# Patient Record
Sex: Female | Born: 1950 | Race: White | Hispanic: No | Smoking: Never smoker
Health system: Southern US, Community
[De-identification: ages and names within clinical notes are randomized; demographics above are authoritative.]

## PROBLEM LIST (undated history)

## (undated) DIAGNOSIS — F319 Bipolar disorder, unspecified: Secondary | ICD-10-CM

## (undated) DIAGNOSIS — R5383 Other fatigue: Secondary | ICD-10-CM

## (undated) DIAGNOSIS — M2550 Pain in unspecified joint: Secondary | ICD-10-CM

## (undated) DIAGNOSIS — E785 Hyperlipidemia, unspecified: Secondary | ICD-10-CM

## (undated) DIAGNOSIS — I10 Essential (primary) hypertension: Secondary | ICD-10-CM

## (undated) DIAGNOSIS — R002 Palpitations: Secondary | ICD-10-CM

## (undated) HISTORY — DX: Bipolar disorder, unspecified: F31.9

## (undated) HISTORY — DX: Essential (primary) hypertension: I10

## (undated) HISTORY — DX: Other fatigue: R53.83

## (undated) HISTORY — DX: Palpitations: R00.2

## (undated) HISTORY — DX: Hyperlipidemia, unspecified: E78.5

## (undated) HISTORY — DX: Pain in unspecified joint: M25.50

---

## 1978-12-28 HISTORY — PX: TUBAL LIGATION: SHX77

## 1979-12-29 HISTORY — PX: PARTIAL HYSTERECTOMY: SHX80

## 1993-12-28 HISTORY — PX: CHOLECYSTECTOMY: SHX55

## 1998-09-19 ENCOUNTER — Encounter: Admission: RE | Admit: 1998-09-19 | Discharge: 1998-09-19 | Payer: Self-pay | Admitting: *Deleted

## 2003-03-03 ENCOUNTER — Inpatient Hospital Stay (HOSPITAL_COMMUNITY): Admission: EM | Admit: 2003-03-03 | Discharge: 2003-03-05 | Payer: Self-pay | Admitting: Psychiatry

## 2003-03-28 ENCOUNTER — Encounter: Admission: RE | Admit: 2003-03-28 | Discharge: 2003-03-28 | Payer: Self-pay | Admitting: Psychiatry

## 2003-04-25 ENCOUNTER — Encounter: Admission: RE | Admit: 2003-04-25 | Discharge: 2003-04-25 | Payer: Self-pay | Admitting: *Deleted

## 2003-07-13 ENCOUNTER — Encounter: Admission: RE | Admit: 2003-07-13 | Discharge: 2003-07-13 | Payer: Self-pay | Admitting: Psychiatry

## 2003-07-24 ENCOUNTER — Other Ambulatory Visit (HOSPITAL_COMMUNITY): Admission: RE | Admit: 2003-07-24 | Discharge: 2003-07-25 | Payer: Self-pay | Admitting: Psychiatry

## 2004-04-03 ENCOUNTER — Encounter: Admission: RE | Admit: 2004-04-03 | Discharge: 2004-04-03 | Payer: Self-pay | Admitting: Psychiatry

## 2004-10-07 ENCOUNTER — Ambulatory Visit (HOSPITAL_COMMUNITY): Payer: Self-pay | Admitting: Psychiatry

## 2005-01-07 ENCOUNTER — Ambulatory Visit (HOSPITAL_COMMUNITY): Payer: Self-pay | Admitting: Psychiatry

## 2005-04-06 ENCOUNTER — Ambulatory Visit (HOSPITAL_COMMUNITY): Payer: Self-pay | Admitting: Psychiatry

## 2007-10-14 ENCOUNTER — Ambulatory Visit: Payer: Self-pay | Admitting: Family Medicine

## 2007-10-14 DIAGNOSIS — R002 Palpitations: Secondary | ICD-10-CM | POA: Insufficient documentation

## 2007-11-08 ENCOUNTER — Encounter: Admission: RE | Admit: 2007-11-08 | Discharge: 2007-11-08 | Payer: Self-pay | Admitting: Family Medicine

## 2007-11-08 DIAGNOSIS — F3289 Other specified depressive episodes: Secondary | ICD-10-CM | POA: Insufficient documentation

## 2007-11-08 DIAGNOSIS — F329 Major depressive disorder, single episode, unspecified: Secondary | ICD-10-CM | POA: Insufficient documentation

## 2007-11-22 ENCOUNTER — Encounter: Payer: Self-pay | Admitting: Family Medicine

## 2008-04-19 ENCOUNTER — Encounter: Admission: RE | Admit: 2008-04-19 | Discharge: 2008-04-19 | Payer: Self-pay | Admitting: Family Medicine

## 2009-10-02 ENCOUNTER — Encounter: Admission: RE | Admit: 2009-10-02 | Discharge: 2009-10-02 | Payer: Self-pay | Admitting: Sports Medicine

## 2009-12-28 HISTORY — PX: OTHER SURGICAL HISTORY: SHX169

## 2010-03-26 ENCOUNTER — Ambulatory Visit (HOSPITAL_COMMUNITY): Payer: Self-pay | Admitting: Psychiatry

## 2010-04-29 ENCOUNTER — Ambulatory Visit (HOSPITAL_COMMUNITY): Payer: Self-pay | Admitting: Licensed Clinical Social Worker

## 2010-05-05 ENCOUNTER — Ambulatory Visit (HOSPITAL_COMMUNITY): Payer: Self-pay | Admitting: Psychiatry

## 2010-05-13 ENCOUNTER — Ambulatory Visit: Payer: Self-pay | Admitting: Family

## 2010-05-13 DIAGNOSIS — M2550 Pain in unspecified joint: Secondary | ICD-10-CM | POA: Insufficient documentation

## 2010-05-13 DIAGNOSIS — R5383 Other fatigue: Secondary | ICD-10-CM

## 2010-05-13 DIAGNOSIS — R5381 Other malaise: Secondary | ICD-10-CM | POA: Insufficient documentation

## 2010-05-13 LAB — CONVERTED CEMR LAB
AST: 21 units/L (ref 0–37)
Alkaline Phosphatase: 61 units/L (ref 39–117)
Basophils Absolute: 0 10*3/uL (ref 0.0–0.1)
Basophils Relative: 0 % (ref 0–1)
Free T4: 0.98 ng/dL (ref 0.80–1.80)
Glucose, Bld: 79 mg/dL (ref 70–99)
MCHC: 32.1 g/dL (ref 30.0–36.0)
Neutro Abs: 3.3 10*3/uL (ref 1.7–7.7)
Neutrophils Relative %: 57 % (ref 43–77)
Platelets: 185 10*3/uL (ref 150–400)
RDW: 13.1 % (ref 11.5–15.5)
Sodium: 143 meq/L (ref 135–145)
T3, Free: 2.6 pg/mL (ref 2.3–4.2)
Total Bilirubin: 0.5 mg/dL (ref 0.3–1.2)
Total Protein: 6.7 g/dL (ref 6.0–8.3)

## 2010-05-14 ENCOUNTER — Telehealth: Payer: Self-pay | Admitting: Family

## 2010-05-14 ENCOUNTER — Ambulatory Visit (HOSPITAL_COMMUNITY): Payer: Self-pay | Admitting: Licensed Clinical Social Worker

## 2010-06-02 ENCOUNTER — Ambulatory Visit (HOSPITAL_COMMUNITY): Payer: Self-pay | Admitting: Licensed Clinical Social Worker

## 2010-06-03 ENCOUNTER — Encounter: Payer: Self-pay | Admitting: Family Medicine

## 2010-06-03 ENCOUNTER — Ambulatory Visit: Payer: Self-pay | Admitting: Family

## 2010-06-04 ENCOUNTER — Telehealth: Payer: Self-pay | Admitting: Family

## 2010-06-04 LAB — CONVERTED CEMR LAB: Vitamin B-12: 450 pg/mL (ref 211–911)

## 2010-06-10 ENCOUNTER — Telehealth: Payer: Self-pay | Admitting: Family

## 2010-06-24 ENCOUNTER — Ambulatory Visit (HOSPITAL_COMMUNITY): Payer: Self-pay | Admitting: Psychiatry

## 2010-07-01 ENCOUNTER — Ambulatory Visit (HOSPITAL_COMMUNITY): Payer: Self-pay | Admitting: Licensed Clinical Social Worker

## 2010-07-31 ENCOUNTER — Ambulatory Visit: Payer: Self-pay | Admitting: Family Medicine

## 2010-08-04 ENCOUNTER — Encounter: Payer: Self-pay | Admitting: Family Medicine

## 2010-08-05 LAB — CONVERTED CEMR LAB
LDL Cholesterol: 76 mg/dL (ref 0–99)
Triglycerides: 115 mg/dL (ref <150)

## 2010-08-25 ENCOUNTER — Ambulatory Visit: Payer: Self-pay | Admitting: Family Medicine

## 2010-08-25 DIAGNOSIS — M25569 Pain in unspecified knee: Secondary | ICD-10-CM | POA: Insufficient documentation

## 2010-08-26 ENCOUNTER — Encounter (INDEPENDENT_AMBULATORY_CARE_PROVIDER_SITE_OTHER): Payer: Self-pay | Admitting: *Deleted

## 2010-09-17 ENCOUNTER — Ambulatory Visit: Payer: Self-pay | Admitting: Family Medicine

## 2010-09-30 ENCOUNTER — Encounter: Admission: RE | Admit: 2010-09-30 | Discharge: 2010-09-30 | Payer: Self-pay | Admitting: Family Medicine

## 2010-10-02 LAB — HM MAMMOGRAPHY: HM Mammogram: NORMAL

## 2010-10-09 ENCOUNTER — Ambulatory Visit: Payer: Self-pay | Admitting: Family Medicine

## 2010-12-17 ENCOUNTER — Telehealth: Payer: Self-pay | Admitting: Family Medicine

## 2011-01-27 NOTE — Assessment & Plan Note (Signed)
Summary: CPE   Vital Signs:  Patient profile:   60 year old female Height:      64.5 inches Weight:      232 pounds Pulse rate:   64 / minute BP sitting:   146 / 86  (left arm) Cuff size:   regular  Vitals Entered By: Avon Gully CMA, Duncan Dull) (July 31, 2010 3:57 PM) CC: CPE   Primary Care Provider:  Linford Arnold, C  CC:  CPE.  History of Present Illness: No hx of prior BP.  Having a hard time gettingher weight down.  Has been try to work on it. No specific complaints.     Current Medications (verified): 1)  Xanax 0.5 Mg Tabs (Alprazolam) .... Take 1 Tab By Mouth At Bedtime 2)  Lisinopril 10 Mg Tabs (Lisinopril) .... Take 1 Tablet By Mouth Every Morning 3)  Simvastatin 20 Mg Tabs (Simvastatin) .... Take 1 Tab By Mouth At Bedtime 4)  Vitamin D 1000 Unit Tabs (Cholecalciferol) .... One Tablet By Mouth Daily 5)  Zostavax [address]109 Unt/0.52ml Solr (Zoster Vaccine Live) .... Bring To Office For Administration  Allergies (verified): 1)  ! Asa 2)  ! Codeine 3)  ! * Fish  Comments:  Nurse/Medical Assistant: The patient's medications and allergies were reviewed with the patient and were updated in the Medication and Allergy Lists. Avon Gully CMA, Duncan Dull) (July 31, 2010 3:58 PM)  Past History:  Past Medical History: Last updated: 06/23/2010 PALPITATIONS ARTHRALGIA  FATIGUE BIPOLAR AFFECTIVE DISORDER   Past Surgical History: Last updated: 10/14/2007 Tubal ligation 1980 Partial hysterectomy 1981 Cholecystectomy 1995  Family History: Last updated: 10/14/2007 GM with [place] GM with MI GF with suicide Mother, Uncle, and GF with palpitations.   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  HS diploma with 2 adult children.  Never Smoked Alcohol use-no Drug use-no Regular exercise-no  Review of Systems  The patient denies anorexia, fever, weight loss, weight gain, vision loss, decreased hearing, hoarseness, chest pain,  syncope, dyspnea on exertion, peripheral edema, prolonged cough, headaches, hemoptysis, abdominal pain, melena, hematochezia, severe indigestion/heartburn, hematuria, incontinence, genital sores, muscle weakness, suspicious skin lesions, transient blindness, difficulty walking, depression, unusual weight change, abnormal bleeding, enlarged lymph nodes, breast masses, and testicular masses.    Physical Exam  General:  Well-developed,well-nourished,in no acute distress; alert,appropriate and cooperative throughout examination. Overweight.  Head:  Normocephalic and atraumatic without obvious abnormalities. No apparent alopecia or balding. Eyes:  No corneal or conjunctival inflammation noted. EOMI. Perrla.  Ears:  External ear exam shows no significant lesions or deformities.  Otoscopic examination reveals clear canals, tympanic membranes are intact bilaterally without bulging, retraction, inflammation or discharge. Hearing is grossly normal bilaterally. Nose:  External nasal examination shows no deformity or inflammation.  Mouth:  Oral mucosa and oropharynx without lesions or exudates.  Teeth in good repair. Neck:  No deformities, masses, or tenderness noted. Chest Wall:  No deformities, masses, or tenderness noted. Breasts:  No mass, nodules, thickening, tenderness, bulging, retraction, inflamation, nipple discharge or skin changes noted.   Lungs:  Normal respiratory effort, chest expands symmetrically. Lungs are clear to auscultation, no crackles or wheezes. Heart:  Normal rate and regular rhythm. S1 and S2 normal without gallop, murmur, click, rub or other extra sounds. Abdomen:  Bowel sounds positive,abdomen soft and non-tender without masses, organomegaly or hernias noted. Msk:  No deformity or scoliosis noted of thoracic or lumbar spine.   Pulses:  R and L carotid,radial,dorsalis pedis and  posterior tibial pulses are full and equal bilaterally Extremities:  No clubbing, cyanosis, edema, or  deformity noted with normal full range of motion of all joints.   Neurologic:  No cranial nerve deficits noted. Station and gait are normal. DTRs are symmetrical throughout. Sensory, motor and coordinative functions appear intact. Skin:  no rashes.   Cervical Nodes:  No lymphadenopathy noted Axillary Nodes:  No palpable lymphadenopathy Psych:  Cognition and judgment appear intact. Alert and cooperative with normal attention span and concentration. No apparent delusions, illusions, hallucinations   Impression & Recommendations:  Problem # 1:  HEALTH MAINTENANCE EXAM (ICD-V70.0) Exam is normal today. Due for lipids. Had CMP in June that was norma Encouraged weight loss and exercise Given infor for mammogram.. Given stool cards since declined colonoscopy.  Tdap given today.  Orders: T-Lipid Profile ([id number])  Complete Medication List: 1)  Xanax 0.5 Mg Tabs (Alprazolam) .... Take 1 tab by mouth at bedtime 2)  Lisinopril 10 Mg Tabs (Lisinopril) .... Take 1 tablet by mouth every morning 3)  Simvastatin 20 Mg Tabs (Simvastatin) .... Take 1 tab by mouth at bedtime 4)  Vitamin D 1000 Unit Tabs (Cholecalciferol) .... One tablet by mouth daily 5)  Zostavax 75643 Unt/0.31ml Solr (Zoster vaccine live) .... Bring to office for administration  Other Orders: Tdap => 19yrs IM ([id number]) Admin 1st Vaccine (88416)  Patient Instructions: 1)  Given stool cards.  2)  Call [phone] to schedule your mammogram.  3)  We will call you with your cholesterol results.  4)  It is important that you exercise regularly at least 20 minutes 5 times a week. If you develop chest pain, have severe difficulty breathing, or feel very tired , stop exercising immediately and seek medical attention. 5)  Take calcium +Vitamin D daily.  Flex Sig Next Due:  Not Indicated Colonoscopy Next Due:  10 yr PAP Next Due:  Not Indicated    Immunization History:  Zostavax History:    Zostavax:  zostavax  (05/28/2010)    Immunization History:  Zostavax History:    Zostavax:  zostavax (05/28/2010)  Immunizations Administered:  Tetanus Vaccine:    Vaccine Type: Tdap    Site: right deltoid    Mfr: GlaxoSmithKline    Dose: 0.5 ml    Route: IM    Given by: Sue Lush McCrimmon CMA, (AAMA)    Exp. Date: 06/26/2012    Lot #: [id number]    VIS given: 11/15/07 version given July 31, 2010.
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
## 2011-01-27 NOTE — Assessment & Plan Note (Signed)
Summary: feels tired, HR gets fast sometimes- jr   Vital Signs:  Patient profile:   60 year old female Height:      64.5 inches Weight:      233 pounds BMI:     39.52 Temp:     98.2 degrees F oral Pulse rate:   71 / minute Pulse rhythm:   regular Resp:     16 per minute BP sitting:   124 / 72  (right arm) Cuff size:   large  Vitals Entered By: Mervin Kung CMA (May 13, 2010 4:31 PM) CC: room 4  Headache daily, body aches and muscle weakness, fatigue and lack of focus since coming off of depression meds. Is Patient Diabetic? No   Primary Care Provider:  Linford Arnold, C  CC:  room 4  Headache daily, body aches and muscle weakness, and fatigue and lack of focus since coming off of depression meds..  History of Present Illness: Shelby Moore is a 60 year old female with hx of depression who presents today with c/o extreme fatigue.  Notes occasional palpitations, + HA, +muscle and joint aching. +Weakness.  Denies fever.  Was on Lamictal, and Wellbutrin which she has weaned off of under the supervision of her psychiatrist.  Her last dose of Wellbutrin was greater than 1 month ago.  She has been off of the Lamictal even longer.  Reports that her depression and mood is well controlled presently off of meds.    Allergies: 1)  ! Asa 2)  ! Codeine  Physical Exam  General:  Well-developed,well-nourished,in no acute distress; alert,appropriate and cooperative throughout examination Head:  Normocephalic and atraumatic without obvious abnormalities. No apparent alopecia or balding. Neck:  No deformities, masses, or tenderness noted. Lungs:  Normal respiratory effort, chest expands symmetrically. Lungs are clear to auscultation, no crackles or wheezes. Heart:  Normal rate and regular rhythm. S1 and S2 normal without gallop, murmur, click, rub or other extra sounds. Abdomen:  Bowel sounds positive,abdomen soft and non-tender without masses, organomegaly or hernias noted.   Impression &  Recommendations:  Problem # 1:  FATIGUE (ICD-780.79) Assessment New EKG notes nonspecific T wave abnormality, sinus rythm 68.  Pt with intermittent palpitations.  Review of pt's record notes that patient was evaluated by Dr Linford Arnold back in 2008 for palpitations and was noted to have an abnormal EKG at that time.  It was recommended that she have a stress test, however she did not complete this.    TFT's WNL,  RA neg, CBC, CMET WNL.  ANA pending.  It is possible that patient's symptoms are related to Wellbutrin withdrawal, however she has been off of this drug for greater than 1 month.  Given recurrent nature of palpitations/abnormal EKG and normal TFT's will plan referral to cardiology for further evaluation +/- stress testing if indicated.  Will plan follow up in 2 weeks.    Orders: T-Comprehensive Metabolic Panel [id number]) T-CBC w/Diff ([id number])  Problem # 2:  PALPITATIONS (ICD-785.1) Assessment: Deteriorated Cardiology referral see above discussion. Orders: T-Comprehensive Metabolic Panel [id number]) T-CBC w/Diff [id number] [phone]) T-TSH [id number]) T-T3, Free [id number]) T-T4, Free [phone]) Cardiology Referral (Cardiology)  Complete Medication List: 1)  Xanax 0.5 Mg Tabs (Alprazolam) .... Take 1 tab by mouth at bedtime 2)  Lisinopril 10 Mg Tabs (Lisinopril) .... Take 1 tablet by mouth every morning 3)  Simvastatin 20 Mg Tabs (Simvastatin) .... Take 1 tab by mouth at bedtime  Other Orders: T-ANA [id number]) T-RA (Rheumatoid Factor) [phone])  Patient Instructions: 1)  Please complete your lab work. 2)  Follow up in 2 weeks  Current Allergies (reviewed today): ! ASA ! CODEINE

## 2011-01-27 NOTE — Letter (Signed)
Summary: Out of Work  [facility]  [address]   [place], [place] [address]   Phone: [phone]  Fax: [phone]    August 26, 2010   Employee:  Flynn A Rosevear    To Whom It May Concern:   For Medical reasons, please excuse the above named employee from work for the following dates:  Start:   08/25/10  End:   08/26/10 (pt is to return to work on this date)  If you need additional information, please feel free to contact our office.         Sincerely,    Nani Gasser, MD

## 2011-01-27 NOTE — Letter (Signed)
Summary: Anxiety Questionnaire  Anxiety Questionnaire   Imported By: Lanelle Bal 10/17/2010 15:54:12  _____________________________________________________________________  External Attachment:    Type:   Image     Comment:   External Document

## 2011-01-27 NOTE — Progress Notes (Signed)
  Phone Note Outgoing Call   Call placed by: Lemont Fillers FNP,  May 14, 2010 10:08 AM Call placed to: Patient Summary of Call: Discussed with patient plans to refer to cardiology. Initial call taken by: Lemont Fillers FNP,  May 14, 2010 10:08 AM

## 2011-01-27 NOTE — Progress Notes (Signed)
Summary: Shingles vaccine  Phone Note Call from Patient Call back at Home Phone [phone]   Caller: Patient Call For: [facility] Summary of Call: pt would like a rx for the shingles vaccine sent to [facility] in [place] and they will get it Initial call taken by: Kathlene November,  June 10, 2010 2:25 PM    New/Updated Medications: ZOSTAVAX 14782 UNT/0.65ML SOLR (ZOSTER VACCINE LIVE) Bring to office for administration Prescriptions: ZOSTAVAX 95621 UNT/0.65ML SOLR (ZOSTER VACCINE LIVE) Bring to office for administration  #1 x 0   Entered and Authorized by:   Lemont Fillers FNP   Signed by:   Lemont Fillers FNP on 06/10/2010   Method used:   Electronically to        [facility]* (retail)       [address]       [place], [place]  [address]       Ph: [phone]       Fax: [phone]   RxID:   [id number]

## 2011-01-27 NOTE — Letter (Signed)
Summary: Out of Work  [facility]  [address], [address]   [place], [place] [address]   Phone: [phone]  Fax: [phone]    August 25, 2010   Employee:  Anzal A Bibbee    To Whom It May Concern:   For Medical reasons, please excuse the above named employee from work for the following dates:  Start:   09-25-10  End:   09-26-10  If you need additional information, please feel free to contact our office.         Sincerely,    Nani Gasser MD

## 2011-01-27 NOTE — Assessment & Plan Note (Signed)
Summary: 3 week f/u on mood   Vital Signs:  Patient profile:   60 year old female Height:      64.5 inches Weight:      222 pounds Pulse rate:   67 / minute BP sitting:   149 / 79  (left arm) Cuff size:   large  Vitals Entered By: Kathlene November LPN (October 09, 2010 3:44 PM) CC: follow-up mood. Pt on1 tab daily of Citalopram   Primary Care Provider:  Linford Arnold, C  CC:  follow-up mood. Pt on1 tab daily of Citalopram.  History of Present Illness: follow-up mood. Pt on1 tab daily of Citalopram. Doing very well.  Say it does make her feel a little jittery but overall she feels her mood has improved. She is sleeping better. Denies any manic symptoms.   Current Medications (verified): 1)  Xanax 0.5 Mg Tabs (Alprazolam) .... Take 1 Tab By Mouth At Bedtime 2)  Simvastatin 20 Mg Tabs (Simvastatin) .... Take 1 Tab By Mouth At Bedtime 3)  Hydrocodone-Acetaminophen 5-325 Mg Tabs (Hydrocodone-Acetaminophen) .Marland Kitchen.. 1-2 Tabs By Mouth Every 6 Hour As Needed. 4)  Citalopram Hydrobromide 20 Mg Tabs (Citalopram Hydrobromide) .... 1/2 Tab By Mouth Daily For One Week, Then Increase To Whole Tab. After 2 Weeks Then Increase To 1.5 Tabs.  Allergies (verified): 1)  ! Asa 2)  ! Codeine 3)  ! * Fish  Past History:  Past Surgical History: Tubal ligation 1980 Partial hysterectomy 1981 Cholecystectomy 1995 Left knee arthroscopic surgery 2011   Impression & Recommendations:  Problem # 1:  DEPRESSION, SEVERE (ICD-311) Repeat PHQ-9 is 2. Notes she did recently lose her job but she is appealing the decision so would like to have  90 day rx on her medss. REfilled her xanax early.  Her updated medication list for this problem includes:    Xanax 0.5 Mg Tabs (Alprazolam) .Marland Kitchen... Take 1 tab by mouth at bedtime    Citalopram Hydrobromide 20 Mg Tabs (Citalopram hydrobromide) .Marland Kitchen... Take 1 tablet by mouth once a day  Problem # 2:  KNEE PAIN, LEFT (ICD-719.46) Had knee surgey yesterday and is doing well.  Her  updated medication list for this problem includes:    Hydrocodone-acetaminophen 5-325 Mg Tabs (Hydrocodone-acetaminophen) .Marland Kitchen... 1-2 tabs by mouth every 6 hour as needed.  Complete Medication List: 1)  Xanax 0.5 Mg Tabs (Alprazolam) .... Take 1 tab by mouth at bedtime 2)  Simvastatin 20 Mg Tabs (Simvastatin) .... Take 1 tab by mouth at bedtime 3)  Hydrocodone-acetaminophen 5-325 Mg Tabs (Hydrocodone-acetaminophen) .Marland Kitchen.. 1-2 tabs by mouth every 6 hour as needed. 4)  Citalopram Hydrobromide 20 Mg Tabs (Citalopram hydrobromide) .... Take 1 tablet by mouth once a day  Contraindications/Deferment of Procedures/Staging:    Test/Procedure: FLU VAX    Reason for deferment: patient declined   Patient Instructions: 1)  Please schedule a follow-up appointment in 2-3 months.  Prescriptions: XANAX 0.5 MG TABS (ALPRAZOLAM) Take 1 tab by mouth at bedtime  #30 x 1   Entered and Authorized by:   Nani Gasser MD   Signed by:   Nani Gasser MD on 10/09/2010   Method used:   Printed then faxed to ...       [facility] [phone]* (retail)       [address] [place], [place]  [address]       Ph: [phone]       Fax: [phone]   RxID:   [id number] CITALOPRAM  HYDROBROMIDE 20 MG TABS (CITALOPRAM HYDROBROMIDE) Take 1 tablet by mouth once a day  #90 x 1   Entered and Authorized by:   Nani Gasser MD   Signed by:   Nani Gasser MD on 10/09/2010   Method used:   Printed then faxed to ...       [facility] [phone]* (retail)       [address] [place], [place]  [address]       Ph: [phone]       Fax: [phone]   RxID:   [id number] SIMVASTATIN 20 MG TABS (SIMVASTATIN) Take 1 tab by mouth at bedtime  #90 x 2   Entered and Authorized by:   Nani Gasser MD   Signed by:   Nani Gasser MD on 10/09/2010   Method used:   Printed then faxed to ...       [facility] [phone]* (retail)       [address] [place],  [place]  [address]       Ph: [phone]       Fax: [phone]   RxID:   [id number]     Appended Document: 3 week f/u on mood PHQ-9 socre was 3 (none).

## 2011-01-27 NOTE — Progress Notes (Signed)
  Phone Note Outgoing Call   Summary of Call: Pls call patient and notify her that her vitamin D is a little low.  She should start Vitamin D supplement see Dr. Shelah Lewandowsky note. Initial call taken by: Lemont Fillers FNP,  June 04, 2010 4:52 PM    New/Updated Medications: VITAMIN D 1000 UNIT TABS (CHOLECALCIFEROL) 3 tabs by mouth daily VITAMIN D 1000 UNIT TABS (CHOLECALCIFEROL) one tablet by mouth daily  Appended Document:  06/05/2010 @ 7:56am-Pt notified of resutls. KJ LPN

## 2011-01-27 NOTE — Assessment & Plan Note (Signed)
Summary: 2 week f/u - jr   Vital Signs:  Patient profile:   60 year old female Height:      64.5 inches Weight:      235 pounds Pulse rate:   80 / minute BP sitting:   155 / 77  (left arm) Cuff size:   large  Vitals Entered By: Kathlene November (June 03, 2010 3:38 PM) CC: followup fatigue- pt states feels the same no different. Has appt with cardiologists on 29th   Primary Care Provider:  Linford Arnold, C  CC:  followup fatigue- pt states feels the same no different. Has appt with cardiologists on 29th.  History of Present Illness: Ms Goin is a 60 year old female with history of bipolar disorder who presents today in follow up with complaint of ongoing fatigue.  She notes some improvement in her myalgias since her last visit. Patient sleeps about 7 hours a day.  Feels good in the morning- Then starts to feel tired around 10AM.  She has not been told by others that she snores at night.  She notes that these symptoms started 2-3 months ago.  She has been off of her depression meds for about 3 weeks.  Last visit CBC, TSH, CMET, ANA,RA were all WNL.  Current Medications (verified): 1)  Xanax 0.5 Mg Tabs (Alprazolam) .... Take 1 Tab By Mouth At Bedtime 2)  Lisinopril 10 Mg Tabs (Lisinopril) .... Take 1 Tablet By Mouth Every Morning 3)  Simvastatin 20 Mg Tabs (Simvastatin) .... Take 1 Tab By Mouth At Bedtime  Allergies (verified): 1)  ! Asa 2)  ! Codeine  Comments:  Nurse/Medical Assistant: The patient's medications and allergies were reviewed with the patient and were updated in the Medication and Allergy Lists. Kathlene November (June 03, 2010 3:39 PM)  Review of Systems       Mild swelling in ankles at the end of the night. Reports depression is well controlled.  Physical Exam  General:  Pleasant white female awake, alert and in NAD Lungs:  Normal respiratory effort, chest expands symmetrically. Lungs are clear to auscultation, no crackles or wheezes. Heart:  Normal rate and regular  rhythm. S1 and S2 normal without gallop, murmur, click, rub or other extra sounds. Psych:  Oriented X3, memory intact for recent and remote, normally interactive, and subdued.     Impression & Recommendations:  Problem # 1:  FATIGUE (ICD-780.79) Assessment Unchanged PHQ-9 was performed today.  Patient scored 5/27 placing her in the mild depression zone.  She tells me that she has an upcoming appointment with her psychiatrist.  I encouraged her to keep this appointment and to inform her psychiatrist about her fatigue.  It is possible that her symptoms are related to depression.  She also notes that she has a great deal of stress which could also be an aggravating factor.   Orders: T-Assay of Vitamin D [phone]) T-Vitamin B12 [phone])  Complete Medication List: 1)  Xanax 0.5 Mg Tabs (Alprazolam) .... Take 1 tab by mouth at bedtime 2)  Lisinopril 10 Mg Tabs (Lisinopril) .... Take 1 tablet by mouth every morning 3)  Simvastatin 20 Mg Tabs (Simvastatin) .... Take 1 tab by mouth at bedtime  Patient Instructions: 1)  Please keep your upcoming appointment with Dr. Christell Constant. 2)  Please schedule an appointment for a complete physical. Prescriptions: SIMVASTATIN 20 MG TABS (SIMVASTATIN) Take 1 tab by mouth at bedtime  #30 x 2   Entered and Authorized by:   Efraim Kaufmann  Arvil Chaco FNP   Signed by:   Lemont Fillers FNP on 06/03/2010   Method used:   Print then Give to Patient   RxID:   [id number] LISINOPRIL 10 MG TABS (LISINOPRIL) Take 1 tablet by mouth every morning  #30 x 2   Entered and Authorized by:   Lemont Fillers FNP   Signed by:   Lemont Fillers FNP on 06/03/2010   Method used:   Print then Give to Patient   RxID:   [id number] XANAX 0.5 MG TABS (ALPRAZOLAM) Take 1 tab by mouth at bedtime  #30 x 0   Entered and Authorized by:   Lemont Fillers FNP   Signed by:   Lemont Fillers FNP on 06/03/2010   Method used:   Print then Give to Patient    RxID:   [id number]

## 2011-01-27 NOTE — Letter (Signed)
Summary: Depression Questionnaire  Depression Questionnaire   Imported By: Lanelle Bal 10/17/2010 16:23:06  _____________________________________________________________________  External Attachment:    Type:   Image     Comment:   External Document

## 2011-01-27 NOTE — Assessment & Plan Note (Signed)
Summary: Depression issue   Vital Signs:  Patient profile:   60 year old female Height:      64.5 inches Weight:      222 pounds Pulse rate:   85 / minute BP sitting:   136 / 78  (right arm) Cuff size:   large  Vitals Entered By: Avon Gully CMA, Duncan Dull) (September 17, 2010 11:28 AM) CC: discuss depression, pt has been taking zoloft    Primary Care Provider:  Linford Arnold, C  CC:  discuss depression and pt has been taking zoloft .  History of Present Illness: discuss depression, pt has been taking zoloft. Hx of severe depression.  Startedin 1062.  Had some zoloft left over and started it aout 3 weeks ago. Feels it is not helping her mood at all. Denies any suicidla thoughts. Having a hard time making it through her day at work .  Not tearful but feels down and "flat".  Say was told once a doctor who she had only met once that she was bipolar. She feels this is nto accurate and denies any mania type sxs.  Has tries several SSRI and a few mood stabilizers in the past and not convinced any of them have worked well for her. She is specifically interested in citalopram.    Current Medications (verified): 1)  Xanax 0.5 Mg Tabs (Alprazolam) .... Take 1 Tab By Mouth At Bedtime 2)  Simvastatin 20 Mg Tabs (Simvastatin) .... Take 1 Tab By Mouth At Bedtime 3)  Vitamin D 1000 Unit Tabs (Cholecalciferol) .... One Tablet By Mouth Daily 4)  Hydrocodone-Acetaminophen 5-325 Mg Tabs (Hydrocodone-Acetaminophen) .Marland Kitchen.. 1-2 Tabs By Mouth Every 6 Hour As Needed.  Allergies (verified): 1)  ! Asa 2)  ! Codeine 3)  ! * Fish  Comments:  Nurse/Medical Assistant: The patient's medications and allergies were reviewed with the patient and were updated in the Medication and Allergy Lists. Avon Gully CMA, Duncan Dull) (September 17, 2010 11:29 AM)  Physical Exam  General:  Well-developed,well-nourished,in no acute distress; alert,appropriate and cooperative throughout examination Psych:  Cognition and  judgment appear intact. Alert and cooperative with normal attention span and concentration. No apparent delusions, illusions, hallucinations   Impression & Recommendations:  Problem # 1:  BIPOLAR AFFECTIVE DISORDER (ICD-296.80) PHQ-9 score is 22 ( severe depression). Sxs are recurrent. Will try citalopram first.  Discussed potential SE inc suicidal thoughts. Call if any problems. Also discussed if nto helping then will need to consider gong back to the mood stabilizers or adding a mood stabilizer. Pt agrees to f/u in 3 weeks.   Complete Medication List: 1)  Xanax 0.5 Mg Tabs (Alprazolam) .... Take 1 tab by mouth at bedtime 2)  Simvastatin 20 Mg Tabs (Simvastatin) .... Take 1 tab by mouth at bedtime 3)  Vitamin D 1000 Unit Tabs (Cholecalciferol) .... One tablet by mouth daily 4)  Hydrocodone-acetaminophen 5-325 Mg Tabs (Hydrocodone-acetaminophen) .Marland Kitchen.. 1-2 tabs by mouth every 6 hour as needed. 5)  Citalopram Hydrobromide 20 Mg Tabs (Citalopram hydrobromide) .... 1/2 tab by mouth daily for one week, then increase to whole tab. after 2 weeks then increase to 1.5 tabs.  Patient Instructions: 1)  Please schedule a follow-up appointment in 3 weeks for mood.  2)  Cut your zoloft in half for 3-4 days and then switch meds.   Contraindications/Deferment of Procedures/Staging:    Test/Procedure: FLU VAX    Reason for deferment: patient declined  Prescriptions: CITALOPRAM HYDROBROMIDE 20 MG TABS (CITALOPRAM HYDROBROMIDE) 1/2 tab by mouth daily  for one week, then increase to whole tab. After 2 weeks then increase to 1.5 tabs.  #30 x 0   Entered and Authorized by:   Nani Gasser MD   Signed by:   Nani Gasser MD on 09/17/2010   Method used:   Electronically to        [facility] [phone]* (retail)       [address]       [place], [place]  [address]       Ph: [phone]       Fax: [phone]   RxID:   [id number]

## 2011-01-29 NOTE — Progress Notes (Signed)
Summary: xanax refill  Phone Note Refill Request Message from:  Patient on December 17, 2010 1:32 PM  Refills Requested: Medication #1:  XANAX 0.5 MG TABS Take 1 tab by mouth at bedtime   Supply Requested: 1 month [facility] to [facility] in [place]   Method Requested: Fax to Local Pharmacy Initial call taken by: Kathlene November LPN,  December 17, 2010 1:33 PM    Prescriptions: Prudy Feeler 0.5 MG TABS (ALPRAZOLAM) Take 1 tab by mouth at bedtime  #30 x 1   Entered and Authorized by:   Nani Gasser MD   Signed by:   Nani Gasser MD on 12/17/2010   Method used:   Printed then faxed to ...       [facility] [phone]* (retail)       [address] [place], [place]  [address]       Ph: [phone]       Fax: [phone]   RxID:   [id number]

## 2011-02-19 ENCOUNTER — Encounter: Payer: Self-pay | Admitting: Family Medicine

## 2011-02-19 ENCOUNTER — Ambulatory Visit (INDEPENDENT_AMBULATORY_CARE_PROVIDER_SITE_OTHER): Payer: 59 | Admitting: Family Medicine

## 2011-02-19 DIAGNOSIS — F329 Major depressive disorder, single episode, unspecified: Secondary | ICD-10-CM

## 2011-02-19 DIAGNOSIS — F3289 Other specified depressive episodes: Secondary | ICD-10-CM

## 2011-02-19 DIAGNOSIS — I10 Essential (primary) hypertension: Secondary | ICD-10-CM | POA: Insufficient documentation

## 2011-02-24 NOTE — Assessment & Plan Note (Signed)
Summary: depression, hypertension   Vital Signs:  Patient profile:   60 year old female Height:      64.5 inches Weight:      234 pounds Pulse rate:   75 / minute BP sitting:   147 / 81  (right arm) Cuff size:   regular  Vitals Entered By: Avon Gully CMA, Duncan Dull) (February 19, 2011 3:11 PM) CC: f/u mood   Primary Care Provider:  Linford Arnold, C  CC:  f/u mood.  History of Present Illness: Had lost her job back in October. Stopped her citalopram and didn't feel it was working. Came off of it for awhile.  So reastart her old zoloft prescription and started her 100mg  dose about 1 week ago.  Still some irritable. Not tearful. Less motivated.  Has been getting out more of the house.   Current Medications (verified): 1)  Xanax 0.5 Mg Tabs (Alprazolam) .... Take 1 Tab By Mouth At Bedtime 2)  Simvastatin 20 Mg Tabs (Simvastatin) .... Take 1 Tab By Mouth At Bedtime 3)  Zoloft 100 Mg Tabs (Sertraline Hcl) .... Take One Tablet By Mouth Once A Day  Allergies (verified): 1)  ! Asa 2)  ! Codeine 3)  ! * Fish  Comments:  Nurse/Medical Assistant: The patient's medications and allergies were reviewed with the patient and were updated in the Medication and Allergy Lists. Avon Gully CMA, Duncan Dull) (February 19, 2011 3:12 PM)  Physical Exam  General:  Well-developed,well-nourished,in no acute distress; alert,appropriate and cooperative throughout examination. Obese.  Head:  Normocephalic and atraumatic without obvious abnormalities. No apparent alopecia or balding. Neck:  No deformities, masses, or tenderness noted. Lungs:  Normal respiratory effort, chest expands symmetrically. Lungs are clear to auscultation, no crackles or wheezes. Heart:  Normal rate and regular rhythm. S1 and S2 normal without gallop, murmur, click, rub or other extra sounds.   Impression & Recommendations:  Problem # 1:  DEPRESSION, SEVERE (ICD-311) PHQ-9 score of 5.  IMproved. Discussed volunteering adn  putting her skills to use to feel like she has a purpose and to help wiht her mood Discussed the importance of regular exercise as well. I think the zoloft is a good food. New rx given. without insurance right now she cannot afford counseling. F/U in 2 mo.  The following medications were removed from the medication list:    Citalopram Hydrobromide 20 Mg Tabs (Citalopram hydrobromide) .Marland Kitchen... Take 1 tablet by mouth once a day Her updated medication list for this problem includes:    Xanax 0.5 Mg Tabs (Alprazolam) .Marland Kitchen... Take 1 tab by mouth at bedtime    Sertraline Hcl 100 Mg Tabs (Sertraline hcl) .Marland Kitchen... Take 1 tablet by mouth once a day  Problem # 2:  HYPERTENSION, BENIGN (ICD-401.1) Assessment: New Discussed startng new BP med in addition to regular exercise (which she is not doing), and DASH diet.  F/U for recheck in 2 months since she doesn't have insurance. Can check BMP at that time. Seh has gained weight and we are realy working [employer] his.  Her updated medication list for this problem includes:    Lisinopril 5 Mg Tabs (Lisinopril) .Marland Kitchen... Take 1 tablet by mouth once a day  Complete Medication List: 1)  Xanax 0.5 Mg Tabs (Alprazolam) .... Take 1 tab by mouth at bedtime 2)  Simvastatin 20 Mg Tabs (Simvastatin) .... Take 1 tab by mouth at bedtime 3)  Sertraline Hcl 100 Mg Tabs (Sertraline hcl) .... Take 1 tablet by mouth once a day 4)  Lisinopril 5 Mg Tabs (Lisinopril) .... Take 1 tablet by mouth once a day  Patient Instructions: 1)  Work on regular exercise and weight loss 2)  I am starting you on  a low blood pressure pill in the morning.  3)  Please schedule a follow-up appointment in 2 months for blood pressure and mood.  Prescriptions: SIMVASTATIN 20 MG TABS (SIMVASTATIN) Take 1 tab by mouth at bedtime  #90 x 3   Entered and Authorized by:   Nani Gasser MD   Signed by:   Nani Gasser MD on 02/19/2011   Method used:   Printed then faxed to ...       [facility]  [phone]* (retail)       [address] [place], [place]  [address]       Ph: [phone]       Fax: [phone]   RxID:   [id number] Prudy Feeler 0.5 MG TABS (ALPRAZOLAM) Take 1 tab by mouth at bedtime  #30 x 1   Entered and Authorized by:   Nani Gasser MD   Signed by:   Nani Gasser MD on 02/19/2011   Method used:   Printed then faxed to ...       [facility] [phone]* (retail)       [address] [place], [place]  [address]       Ph: [phone]       Fax: [phone]   RxID:   [id number] LISINOPRIL 5 MG TABS (LISINOPRIL) Take 1 tablet by mouth once a day  #90 x 1   Entered and Authorized by:   Nani Gasser MD   Signed by:   Nani Gasser MD on 02/19/2011   Method used:   Printed then faxed to ...       [facility] [phone]* (retail)       [address] [place], [place]  [address]       Ph: [phone]       Fax: [phone]   RxID:   [id number] SERTRALINE HCL 100 MG TABS (SERTRALINE HCL) Take 1 tablet by mouth once a day  #90 x 1   Entered and Authorized by:   Nani Gasser MD   Signed by:   Nani Gasser MD on 02/19/2011   Method used:   Electronically to        [facility] [phone]* (retail)       [address]       [place], [place]  [address]       Ph: [phone]       Fax: [phone]   RxID:   [id number]    Orders Added: 1)  Est. Patient Level III [[address]]

## 2011-03-17 NOTE — Letter (Signed)
Summary: Patient Health Questionnaire  Patient Health Questionnaire   Imported By: Maryln Gottron 03/11/2011 10:10:02  _____________________________________________________________________  External Attachment:    Type:   Image     Comment:   External Document

## 2011-04-26 ENCOUNTER — Encounter: Payer: Self-pay | Admitting: Family Medicine

## 2011-04-27 ENCOUNTER — Ambulatory Visit (INDEPENDENT_AMBULATORY_CARE_PROVIDER_SITE_OTHER): Payer: 59 | Admitting: Family Medicine

## 2011-04-27 ENCOUNTER — Encounter: Payer: Self-pay | Admitting: Family Medicine

## 2011-04-27 VITALS — BP 138/79 | HR 105 | Ht 64.0 in | Wt 231.0 lb

## 2011-04-27 DIAGNOSIS — F3289 Other specified depressive episodes: Secondary | ICD-10-CM

## 2011-04-27 DIAGNOSIS — F329 Major depressive disorder, single episode, unspecified: Secondary | ICD-10-CM

## 2011-04-27 MED ORDER — BUPROPION HCL 75 MG PO TABS: 75.0000 mg | ORAL_TABLET | Freq: Two times a day (BID) | ORAL | Status: DC

## 2011-04-27 NOTE — Assessment & Plan Note (Addendum)
PHQ- 9 score of 6/ Says just doesn't feel she is completely better.  Discussed different options including additive therapy with wellbutrin or an atypical antipsychotic since she is not sleeping well or Changing off the zoloft. Pt ok with adding wellbutrin and f/u in 3 weeks.

## 2011-04-27 NOTE — Progress Notes (Signed)
  Subjective:    Patient ID: Real Cons, female    DOB: 19-Feb-1951, 60 y.o.   MRN: [id number]  HPI Here to f/u depression. Went up on her zoloft dose and she felt worse, more depressed.  No major stressors right now. But if has a conflict with her kids or gets bad news it gets her really down. She is post menopausal, no hot flashes.  Still just doesn't feel like herself.   Has been on welbutin in the past and did well.    Review of Systems     Objective:   Physical Exam  Constitutional: She appears well-developed and well-nourished.  Cardiovascular: Normal rate, regular rhythm and normal heart sounds.   Pulmonary/Chest: Effort normal and breath sounds normal.          Assessment & Plan:

## 2011-04-29 ENCOUNTER — Other Ambulatory Visit: Payer: Self-pay | Admitting: *Deleted

## 2011-04-29 MED ORDER — ALPRAZOLAM 0.5 MG PO TABS: 0.5000 mg | ORAL_TABLET | Freq: Every evening | ORAL | Status: DC | PRN

## 2011-05-13 ENCOUNTER — Emergency Department (INDEPENDENT_AMBULATORY_CARE_PROVIDER_SITE_OTHER): Payer: Self-pay

## 2011-05-13 ENCOUNTER — Emergency Department (HOSPITAL_BASED_OUTPATIENT_CLINIC_OR_DEPARTMENT_OTHER)
Admission: EM | Admit: 2011-05-13 | Discharge: 2011-05-13 | Disposition: A | Discharge status: Home / Self Care | Payer: Self-pay | Attending: Emergency Medicine | Admitting: Emergency Medicine

## 2011-05-13 DIAGNOSIS — M25469 Effusion, unspecified knee: Secondary | ICD-10-CM | POA: Insufficient documentation

## 2011-05-13 DIAGNOSIS — M7989 Other specified soft tissue disorders: Secondary | ICD-10-CM

## 2011-05-13 DIAGNOSIS — E785 Hyperlipidemia, unspecified: Secondary | ICD-10-CM | POA: Insufficient documentation

## 2011-05-13 DIAGNOSIS — M1710 Unilateral primary osteoarthritis, unspecified knee: Secondary | ICD-10-CM

## 2011-05-13 DIAGNOSIS — M25569 Pain in unspecified knee: Secondary | ICD-10-CM

## 2011-05-13 DIAGNOSIS — IMO0002 Reserved for concepts with insufficient information to code with codable children: Secondary | ICD-10-CM

## 2011-05-14 ENCOUNTER — Encounter: Payer: Self-pay | Admitting: Family Medicine

## 2011-05-14 ENCOUNTER — Ambulatory Visit (INDEPENDENT_AMBULATORY_CARE_PROVIDER_SITE_OTHER): Payer: Self-pay | Admitting: Family Medicine

## 2011-05-14 VITALS — BP 155/94 | HR 69 | Temp 98.2°F | Ht 64.0 in | Wt 228.0 lb

## 2011-05-14 DIAGNOSIS — M25569 Pain in unspecified knee: Secondary | ICD-10-CM

## 2011-05-14 NOTE — Patient Instructions (Signed)
Take tylenol 500mg  1-2 tabs three times a day for pain. Glucosamine 750mg  twice a day is a supplement that has been shown to help moderate to severe arthritis (You MUST ask a pharmacist if a preparation is available that does NOT have sulfa in it that is safe for you to take) Capsaicin and/or Flexall 454 topically up to four times a day may also help with pain. Cortisone injections are an option and can be done every 3 months if needed. It's important that you continue to stay active. If you are overweight, try to lose weight through diet and exercise. Consider physical therapy to strengthen muscles around the joint that hurts to take pressure off of the joint itself. Shoe inserts with good arch support may be helpful. Heat or ice 15 minutes at a time 3-4 times a day as needed to help with pain. Water aerobics and cycling with low resistance are the best two types of exercise for arthritis. A knee brace may be helpful if your knee feels unstable due to pain.

## 2011-05-15 ENCOUNTER — Encounter: Payer: Self-pay | Admitting: Family Medicine
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
## 2011-05-18 ENCOUNTER — Encounter: Payer: Self-pay | Admitting: Family Medicine

## 2011-05-18 ENCOUNTER — Ambulatory Visit (INDEPENDENT_AMBULATORY_CARE_PROVIDER_SITE_OTHER): Payer: Self-pay | Admitting: Family Medicine

## 2011-05-18 DIAGNOSIS — F3289 Other specified depressive episodes: Secondary | ICD-10-CM

## 2011-05-18 DIAGNOSIS — F329 Major depressive disorder, single episode, unspecified: Secondary | ICD-10-CM

## 2011-05-18 DIAGNOSIS — I10 Essential (primary) hypertension: Secondary | ICD-10-CM

## 2011-05-18 MED ORDER — LISINOPRIL 5 MG PO TABS: 5.0000 mg | ORAL_TABLET | Freq: Every day | ORAL | Status: DC

## 2011-05-18 NOTE — Assessment & Plan Note (Signed)
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  She has arthroscopic surgery to remove torn meniscus in September without injury since then so doubt she has a new meniscal tear.  Will proceed with conservative treatment, repeat cortisone injection - feel pain is likely 2/2 DJD based on history and her exam.  Tylenol, discussed other topical medications as needed for pain, icing.  Consider physical therapy.  F/u 1 month for recheck to see how she is doing.  After informed written consent, patient was seated on exam table.  Left knee was prepped with alcohol swab.  Utilizing anterolateral approach, left knee intraarticular space was injected with 3:1 marcaine:depomedrol.  Patient tolerated procedure well without any immediate complications.

## 2011-05-18 NOTE — Assessment & Plan Note (Signed)
PHQ- 9 score of 3 today. She makes fatigue and sleep as her 2 concern areas all the others were normla. We talked about regular exercise to help her mood, weight and blood pressure. Continue current regimen. She is not sure if she is quite satisfied with her regimen.

## 2011-05-18 NOTE — Assessment & Plan Note (Signed)
Discussde she really needs to start the lisinopril. She never picked it up. I urged her again to start the med and then f/u in one month. Then check BMP at f/u.

## 2011-05-18 NOTE — Progress Notes (Signed)
  Subjective:    Patient ID: Real Cons, female    DOB: 1951-11-24, 60 y.o.   MRN: [id number]  HPI   Here to f/u depression. Overall doing well but not sure if she feels she is at goal. Her knee pain is actually a little better.  She has been doing th exercises Dr. Pearletha Forge gave her. She is very inactive.  She has made major changes to her diet and has lost 2 lbs. She really cut the sugar out of her diet. No suicidal thoughts.  Still c/o of fatigue and sleep issues.   Review of Systems     Objective:   Physical Exam  Constitutional: She is oriented to person, place, and time.  Neurological: She is alert and oriented to person, place, and time.  Psychiatric: She has a normal mood and affect. Her behavior is normal. Judgment and thought content normal.          Assessment & Plan:

## 2011-05-18 NOTE — Progress Notes (Signed)
Subjective:    Patient ID: Shelby Moore, female    DOB: [birth year], 60 y.o.   MRN: [id number]  HPI  60 yo F here for left knee pain.  Patient reports about 1 1/2 years ago she twisted her left knee and felt pain medial aspect of this knee. She was being seen at [facility] in [place] and ended up having a partial medial meniscectomy for a meniscal tear in 08/2010 (after trying a cortisone injection that did not help). Since that time pain has persisted medially. Taking tylenol and has tried icing/heat without much help. Still with swelling. Most pain is medial but 1 1/2 weeks ago had a sharp pain posteriorly. Feels like knee catches and that it is going to give out. No problems with right knee.  Past Medical History  Diagnosis Date  . Palpitations   . Arthralgia   . Fatigue   . Bipolar affective disorder   . Hyperlipidemia   . Hypertension     Current Outpatient Prescriptions on File Prior to Visit  Medication Sig Dispense Refill  . ALPRAZolam (XANAX) 0.5 MG tablet Take 1 tablet (0.5 mg total) by mouth at bedtime as needed.  30 tablet  1  . buPROPion (WELLBUTRIN) 75 MG tablet Take 1 tablet (75 mg total) by mouth 2 (two) times daily.  60 tablet  2  . sertraline (ZOLOFT) 100 MG tablet Take 100 mg by mouth daily.        . simvastatin (ZOCOR) 20 MG tablet Take 20 mg by mouth at bedtime.        Marland Kitchen DISCONTD: lisinopril (PRINIVIL,ZESTRIL) 5 MG tablet Take 5 mg by mouth daily.          Past Surgical History  Procedure Date  . Tubal ligation 1980  . Partial hysterectomy 1981  . Cholecystectomy 1995  . Left knee arthroscopic surgery 2011    Allergies  Allergen Reactions  . Aspirin     REACTION: THROAT SWELLING  . Codeine     REACTION: HALLUCINATIONS    History   Social History  . Marital Status: Divorced    Spouse Name: N/A    Number of Children: N/A  . Years of Education: N/A   Occupational History  . Not on file.   Social History Main Topics  . Smoking status:  Never Smoker   . Smokeless tobacco: Not on file  . Alcohol Use: No  . Drug Use: No  . Sexually Active: Not on file   Other Topics Concern  . Not on file   Social History Narrative   Sister is a patient here, Laurence Ferrari.     Family History  Problem Relation Age of Onset  . Other Mother     palpitation  . Cancer Other     colon  . Heart attack Other   . Hypertension Father   . Diabetes Neg Hx     BP 155/94  Pulse 69  Temp(Src) 98.2 F (36.8 C) (Oral)  Ht 5\' 4"  (1.626 m)  Wt 228 lb (103.42 kg)  BMI 39.14 kg/m2  Review of Systems See HPI above.    Objective:   Physical Exam Gen: NAD L knee: Mild warmth and swelling.  No erythema, bruising. TTP medial joint line.  No palpable post TTP or bakers cyst.  No lateral joint line or post patellar TTP. FROM. Stable to valgus and varus stress.  Negative ant/post drawers.  Negative lachmanns Mcmurrays causes pain medially. Negative apprehension and clarkes NVI distally.  R knee: FROM without pain, swelling, instability.  Assessment & Plan:  1. 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  She has arthroscopic surgery to remove torn meniscus in September without injury since then so doubt she has a new meniscal tear.  Will proceed with conservative treatment, repeat cortisone injection - feel pain is likely 2/2 DJD based on history and her exam.  Tylenol, discussed other topical medications as needed for pain, icing.  Consider physical therapy.  F/u 1 month for recheck to see how she is doing.  After informed written consent, patient was seated on exam table.  Left knee was prepped with alcohol swab.  Utilizing anterolateral approach, left knee intraarticular space was injected with 3:1 marcaine:depomedrol.  Patient tolerated procedure well without any immediate complications.

## 2011-06-16 ENCOUNTER — Ambulatory Visit (INDEPENDENT_AMBULATORY_CARE_PROVIDER_SITE_OTHER): Payer: Self-pay | Admitting: Family Medicine

## 2011-06-16 ENCOUNTER — Encounter: Payer: Self-pay | Admitting: Family Medicine

## 2011-06-16 VITALS — BP 133/81 | HR 104 | Ht 64.0 in | Wt 227.0 lb

## 2011-06-16 DIAGNOSIS — F3289 Other specified depressive episodes: Secondary | ICD-10-CM

## 2011-06-16 DIAGNOSIS — F329 Major depressive disorder, single episode, unspecified: Secondary | ICD-10-CM

## 2011-06-16 DIAGNOSIS — I10 Essential (primary) hypertension: Secondary | ICD-10-CM

## 2011-06-16 MED ORDER — ALPRAZOLAM 1 MG PO TABS: 1.0000 mg | ORAL_TABLET | Freq: Every evening | ORAL | Status: DC | PRN

## 2011-06-16 NOTE — Assessment & Plan Note (Addendum)
She is doing great and accommodation sertraline and Wellbutrin. We'll continue this current regimen for the next 3 months. If she still doing well at that time then we can consider discontinuing one of the medications. She is most interested in stopping the sertraline if possible. Her PHQ 9 score is one today.

## 2011-06-16 NOTE — Progress Notes (Signed)
  Subjective:    Patient ID: Real Cons, female    DOB: June 25, 1951, 60 y.o.   MRN: [id number]  Hypertension This is a chronic problem. The current episode started more than 1 year ago. The problem has been gradually improving since onset. The problem is controlled. Pertinent negatives include no chest pain, palpitations or shortness of breath. There are no associated agents to hypertension. Risk factors for coronary artery disease include no known risk factors. Past treatments include nothing.  She has lost some weight. Has been trying to exercise. She did start the lisinopril 5 mg tab and is tolerating it well. She denies any side effects.  Doing well with her mood on the Wellbutrin. She feels adding this to the Zoloft has made an overall improvement. She is tolerating it well denies any side effects of the medication. Says she is sleeping better on 1mg  of the xanax.  She would like a new prescription for 1 mg instead of the 0.5 mg tab. She feels overall she is happy with her current regimen she has made it through some really stressful things in the last couple of months.   Review of Systems  Respiratory: Negative for shortness of breath.   Cardiovascular: Negative for chest pain and palpitations.       Objective:   Physical Exam  Constitutional: She is oriented to person, place, and time. She appears well-developed and well-nourished.  Cardiovascular: Normal rate, regular rhythm and normal heart sounds.   Pulmonary/Chest: Effort normal and breath sounds normal.  Neurological: She is alert and oriented to person, place, and time.  Skin: Skin is warm and dry.  Psychiatric: She has a normal mood and affect.          Assessment & Plan:

## 2011-06-16 NOTE — Assessment & Plan Note (Signed)
Her blood pressure looks great today. I think is having a low dose ACE inhibitor has made a great improvement. I still encouraged her to continue with exercise and diet and she's able to lose some weight we might be able to discontinue the medication. She has excellent loss 2 pounds since her last visit. I also let her know that if she is having knee pain she can still do upper body exercise and floor exercises and this will still help her

## 2011-07-02 ENCOUNTER — Other Ambulatory Visit: Payer: Self-pay | Admitting: Family Medicine

## 2011-07-02 NOTE — Telephone Encounter (Signed)
[facility] requested refill for xanex 0.5 mg, however; pt on 1 mg and is good on those refills til 08-16-11.  Pharmacist notified. Shelby Newcomer, LPN [facility]

## 2011-07-15 ENCOUNTER — Other Ambulatory Visit: Payer: Self-pay | Admitting: Family Medicine

## 2011-07-15 MED ORDER — SERTRALINE HCL 100 MG PO TABS: 100.0000 mg | ORAL_TABLET | Freq: Every day | ORAL | Status: DC

## 2011-07-27 ENCOUNTER — Other Ambulatory Visit: Payer: Self-pay | Admitting: *Deleted

## 2011-07-27 MED ORDER — BUPROPION HCL 75 MG PO TABS: 75.0000 mg | ORAL_TABLET | Freq: Two times a day (BID) | ORAL | Status: DC

## 2011-08-19 ENCOUNTER — Other Ambulatory Visit: Payer: Self-pay | Admitting: *Deleted

## 2011-08-19 MED ORDER — ALPRAZOLAM 1 MG PO TABS: 1.0000 mg | ORAL_TABLET | Freq: Every evening | ORAL | Status: DC | PRN

## 2011-08-24 ENCOUNTER — Telehealth: Payer: Self-pay | Admitting: Family Medicine

## 2011-08-24 NOTE — Telephone Encounter (Signed)
Pt called and was inquiring about the tetanus she got here in our office last year.   Plan:  When looking back at the CPE office visit the order states she got the dTap, but the imm record states Td.   Reported to the pt she got the boostrix.  Pt said she had missed up because she went to the health dept and got the boostrix today to prevent her from whooping cough because she thought she only got the tetanus plain last yr our office.  Pt asked what she should do?  Because of the time in between the shots I told her she probably is fine, but that she needs to call the PHD back and inform them so that her records will be straight, and so the PHD could advise her if she needed to do anything diff.  I told pt she would just be extra immunized.  Pt voiced understanding. Jarvis Newcomer, LPN [facility]

## 2011-09-24 ENCOUNTER — Encounter: Payer: Self-pay | Admitting: Family Medicine

## 2011-09-24 ENCOUNTER — Ambulatory Visit (INDEPENDENT_AMBULATORY_CARE_PROVIDER_SITE_OTHER): Payer: Self-pay | Admitting: Family Medicine

## 2011-09-24 VITALS — BP 146/83 | HR 67 | Ht 64.0 in | Wt 222.0 lb

## 2011-09-24 DIAGNOSIS — I10 Essential (primary) hypertension: Secondary | ICD-10-CM

## 2011-09-24 MED ORDER — BUPROPION HCL 75 MG PO TABS: 75.0000 mg | ORAL_TABLET | Freq: Two times a day (BID) | ORAL | Status: DC

## 2011-09-24 MED ORDER — ALPRAZOLAM 1 MG PO TABS: 1.0000 mg | ORAL_TABLET | Freq: Every evening | ORAL | Status: DC | PRN

## 2011-09-24 MED ORDER — SIMVASTATIN 20 MG PO TABS: 20.0000 mg | ORAL_TABLET | Freq: Every day | ORAL | Status: DC

## 2011-09-24 MED ORDER — SERTRALINE HCL 100 MG PO TABS: 100.0000 mg | ORAL_TABLET | Freq: Every day | ORAL | Status: DC

## 2011-09-24 MED ORDER — HYDROCHLOROTHIAZIDE 12.5 MG PO TABS: 12.5000 mg | ORAL_TABLET | Freq: Every day | ORAL | Status: DC

## 2011-09-24 NOTE — Patient Instructions (Signed)
1.5 Gram Low Sodium Diet A 1.5 gram sodium diet restricts the amount of sodium in the diet to no more than 1.5 grams (g) or 1500 milligrams (mg) daily. The American Heart Association recommends Americans over the age of 21 to consume no more than 1500 mg of sodium each day to reduce the risk of developing high blood pressure. Research also shows that limiting sodium may reduce heart attack and stroke risk. Many foods contain sodium for flavor and sometimes as a preservative. When the amount of sodium in a diet needs to be low, it is important to know what to look for when choosing foods and drinks. The following includes some information and guidelines to help make it easier for you to adapt to a low sodium diet. QUICK TIPS  Do not add salt to food.   Avoid convenience items and fast food.   Choose unsalted snack foods.   Buy lower sodium products, often labeled as "lower sodium" or "no salt added."   Check food labels to learn how much sodium is in 1 serving.   When eating at a restaurant, ask that your food be prepared with less salt or none, if possible.  READING FOOD LABELS FOR SODIUM INFORMATION The nutrition facts label is a good place to find how much sodium is in foods. Look for products with no more than 400 mg of sodium per serving. Remember that 1.5 g = 1500 mg. The food label may also list foods as:  Sodium-free: Less than 5 mg in a serving.   Very low sodium: 35 mg or less in a serving.   Low-sodium: 140 mg or less in a serving.   Light in sodium: 50% less sodium in a serving. For example, if a food that usually has 300 mg of sodium is changed to become light in sodium, it will have 150 mg of sodium.   Reduced sodium: 25% less sodium in a serving. For example, if a food that usually has 400 mg of sodium is changed to reduced sodium, it will have 300 mg of sodium.  CHOOSING FOODS AVOID  CHOOSE   Grains:  Salted crackers and snack items. Some cereals, including instant hot  cereals. Bread stuffing and biscuit mixes. Seasoned rice or pasta mixes.  Grains:  Unsalted snack items. Low-sodium cereals, oats, puffed wheat and rice, shredded wheat. English muffins and bread. Pasta.   Meats:  Salted, canned, smoked, spiced, pickled meats, including fish and poultry. Bacon, ham, sausage, cold cuts, hot dogs, anchovies.  Meats:  Low-sodium canned tuna and salmon. Fresh or frozen meat, poultry, and fish.   Dairy:  Processed cheese and spreads. Cottage cheese. Buttermilk and condensed milk. Regular cheese.  Dairy:  Milk. Low-sodium cottage cheese. Yogurt. Sour cream. Low-sodium cheese.   Fruits and Vegetables:  Regular canned vegetables. Regular canned tomato sauce and paste. Frozen vegetables in sauces. Olives. Rosita Fire. Relishes. Sauerkraut.  Fruits and Vegetables:  Low-sodium canned vegetables. Low-sodium tomato sauce and paste. Frozen or fresh vegetables. Fresh and frozen fruit.   Condiments:  Canned and packaged gravies. Worcestershire sauce. Tartar sauce. Barbecue sauce. Soy sauce. Steak sauce. Ketchup. Onion, garlic, and table salt. Meat flavorings and tenderizers.  Condiments:  Fresh and dried herbs and spices. Low-sodium varieties of mustard and ketchup. Lemon juice. Tabasco sauce. Horseradish.   SAMPLE 1.5 GRAM SODIUM MEAL PLAN Meal  Foods  Sodium (mg)   Breakfast  1 cup low-fat milk  1 whole-wheat English muffin 1 tablespoon heart-healthy margarine 1 hard-boiled egg  1 small orange  143  [phone] 0   Lunch  1 cup raw carrots  2 tablespoons no salt added peanut butter 2 slices whole-wheat bread 1 tablespoon jelly  cup red grapes  76  5 270 6 2   Dinner  1 cup whole-wheat pasta  1 cup low-sodium tomato sauce 3 ounces (oz) lean ground beef 1 small side salad (1 cup raw spinach leaves,  cup cucumber,  cup yellow bell pepper) with 1 teaspoon olive oil and 1 teaspoon red wine vinegar  2  73 57 25   Snack  1  container low-fat vanilla yogurt  3 graham cracker squares  107  127   Nutrient Analysis Calories: 1745 Protein: 75 g Carbohydrate: 237 g Fat: 57 g Sodium: 1425 mg Information from http://www.long-jenkins.com/. Document Released: 12/14/2005 Document Re-Released: 06/03/2010 [facility] Patient Information 2011 [place], [place].

## 2011-09-24 NOTE — Progress Notes (Signed)
  Subjective:    Patient ID: Real Cons, female    DOB: 06-14-1951, 60 y.o.   MRN: [id number]  HPI On the lisinopril felt fatigued and hot an sweaty.  She stopped it about 2 weeks ago and feel much better now.  Hs lost 5 lbs.  No CP or SOB. She is compliant with her other meds.  She has been trying to eat a more low sat diet.   Review of Systems     Objective:   Physical Exam  Constitutional: She is oriented to person, place, and time. She appears well-developed and well-nourished.  HENT:  Head: Normocephalic and atraumatic.  Cardiovascular: Normal rate, regular rhythm and normal heart sounds.   Pulmonary/Chest: Effort normal and breath sounds normal.  Musculoskeletal: She exhibits no edema.  Neurological: She is alert and oriented to person, place, and time.  Skin: Skin is warm and dry.  Psychiatric: She has a normal mood and affect. Her behavior is normal.          Assessment & Plan:  HTN- Not well controlled but she is off the med. Will add lisinopril to intolerance list. Will start diuretic lose dose. Warned of potential SE.  F/u in 6 weeks. Will check BMP at that time. Continue to work on low sat diet.  She has lost 5 lbs nad I congratulated her on this.   Declined flu vaccine

## 2011-11-05 ENCOUNTER — Encounter: Payer: Self-pay | Admitting: Family Medicine

## 2011-11-05 ENCOUNTER — Ambulatory Visit (INDEPENDENT_AMBULATORY_CARE_PROVIDER_SITE_OTHER): Payer: Self-pay | Admitting: Family Medicine

## 2011-11-05 DIAGNOSIS — F32A Depression, unspecified: Secondary | ICD-10-CM

## 2011-11-05 DIAGNOSIS — F329 Major depressive disorder, single episode, unspecified: Secondary | ICD-10-CM

## 2011-11-05 DIAGNOSIS — F3289 Other specified depressive episodes: Secondary | ICD-10-CM

## 2011-11-05 DIAGNOSIS — I10 Essential (primary) hypertension: Secondary | ICD-10-CM

## 2011-11-05 MED ORDER — BUPROPION HCL ER (SR) 100 MG PO TB12: 100.0000 mg | ORAL_TABLET | Freq: Two times a day (BID) | ORAL | Status: DC

## 2011-11-05 NOTE — Progress Notes (Signed)
  Subjective:    Patient ID: Shelby Moore, female    DOB: 1951/08/01, 60 y.o.   MRN: [id number]  Hypertension This is a chronic problem. The current episode started more than 1 year ago. The problem is controlled. Pertinent negatives include no chest pain or shortness of breath. There are no associated agents to hypertension. Past treatments include diuretics. The current treatment provides mild improvement. There are no compliance problems.    Depression- she is overall felt more down lately. She's been taking her medications regularly. She says overall she feels well in the morning but by the mid-to-late afternoon she feels more down and socially withdrawn. She feels this sometimes does get worse in the wintertime. She is interested in possibly increasing her Wellbutrin.   Review of Systems  Respiratory: Negative for shortness of breath.   Cardiovascular: Negative for chest pain.       Objective:   Physical Exam  Constitutional: She is oriented to person, place, and time. She appears well-developed and well-nourished.  HENT:  Head: Normocephalic and atraumatic.  Cardiovascular: Normal rate, regular rhythm and normal heart sounds.   Pulmonary/Chest: Effort normal and breath sounds normal.  Musculoskeletal: She exhibits no edema.  Neurological: She is alert and oriented to person, place, and time.  Skin: Skin is warm and dry.  Psychiatric: She has a normal mood and affect. Her behavior is normal. Judgment and thought content normal.          Assessment & Plan:  Depression- Discussed changing to 12 hour release since feels her mood drops in the afternoons. We'll change her Wellbutrin to 100 mg SR twice a day. If this is too expensive then please have the pharmacist call our office. We can always put her back on a short acting 3 times a day and see if this works better. Also if she wants to call can always go up to the 150 mg tab. Otherwise followup in 3 months if she is doing  well.  HTN- well controlled. Patient is doing great on her new blood pressure medication without any side effects. Continue medication and follow up in 6 months.

## 2011-11-20 ENCOUNTER — Other Ambulatory Visit: Payer: Self-pay | Admitting: Family Medicine

## 2011-12-10 ENCOUNTER — Ambulatory Visit (INDEPENDENT_AMBULATORY_CARE_PROVIDER_SITE_OTHER): Payer: Self-pay | Admitting: Family Medicine

## 2011-12-10 ENCOUNTER — Encounter: Payer: Self-pay | Admitting: Family Medicine

## 2011-12-10 DIAGNOSIS — J329 Chronic sinusitis, unspecified: Secondary | ICD-10-CM

## 2011-12-10 MED ORDER — AZITHROMYCIN 250 MG PO TABS: ORAL_TABLET | ORAL | Status: AC

## 2011-12-10 NOTE — Progress Notes (Signed)
  Subjective:    Patient ID: Real Cons, female    DOB: 02/06/1951, 60 y.o.   MRN: [id number]  HPI Sinus congestion and pressure bilat for over a week. + severe HA.  Started like a cold. Wheezing in her throat. ST initially but tht has resolved. Eyes are burning and can't wear her contcts. Mild ear pain and pressure.  No OTC cough and cold medications. Using peppermint to open her nose up. She lives with her 31 you parents.  Has has low grade fevers on and off. Had chills this AM. Took Tyelnol this am. Slight cough.    Review of Systems     Objective:   Physical Exam  Constitutional: She is oriented to person, place, and time. She appears well-developed and well-nourished.  HENT:  Head: Normocephalic and atraumatic.  Right Ear: External ear normal.  Left Ear: External ear normal.  Nose: Nose normal.  Mouth/Throat: Oropharynx is clear and moist.       TMs and canals are clear. Tender over the maxillary and frontal sinuses bilaterally.   Eyes: Conjunctivae and EOM are normal. Pupils are equal, round, and reactive to light.  Neck: Neck supple. No thyromegaly present.  Cardiovascular: Normal rate, regular rhythm and normal heart sounds.   Pulmonary/Chest: Effort normal and breath sounds normal. She has no wheezes.  Lymphadenopathy:    She has no cervical adenopathy.  Neurological: She is alert and oriented to person, place, and time.  Skin: Skin is warm and dry.  Psychiatric: She has a normal mood and affect.          Assessment & Plan:  Sinusitis- Will tx with zithro. Cna use mucinex OTC. Stay hydrated. Call if not better in one week. H.O given.

## 2011-12-10 NOTE — Patient Instructions (Signed)

## 2012-02-08 ENCOUNTER — Encounter: Payer: Self-pay | Admitting: Family Medicine

## 2012-02-08 ENCOUNTER — Ambulatory Visit (INDEPENDENT_AMBULATORY_CARE_PROVIDER_SITE_OTHER): Payer: Self-pay | Admitting: Family Medicine

## 2012-02-08 DIAGNOSIS — E669 Obesity, unspecified: Secondary | ICD-10-CM | POA: Insufficient documentation

## 2012-02-08 DIAGNOSIS — I10 Essential (primary) hypertension: Secondary | ICD-10-CM

## 2012-02-08 DIAGNOSIS — F3289 Other specified depressive episodes: Secondary | ICD-10-CM

## 2012-02-08 DIAGNOSIS — F329 Major depressive disorder, single episode, unspecified: Secondary | ICD-10-CM

## 2012-02-08 DIAGNOSIS — F32A Depression, unspecified: Secondary | ICD-10-CM

## 2012-02-08 MED ORDER — SIMVASTATIN 20 MG PO TABS: 20.0000 mg | ORAL_TABLET | Freq: Every day | ORAL | Status: DC

## 2012-02-08 MED ORDER — BUPROPION HCL ER (SR) 100 MG PO TB12: 100.0000 mg | ORAL_TABLET | Freq: Two times a day (BID) | ORAL | Status: AC

## 2012-02-08 MED ORDER — HYDROCHLOROTHIAZIDE 12.5 MG PO CAPS: 12.5000 mg | ORAL_CAPSULE | ORAL | Status: AC

## 2012-02-08 MED ORDER — SERTRALINE HCL 100 MG PO TABS: 100.0000 mg | ORAL_TABLET | Freq: Every day | ORAL | Status: DC

## 2012-02-08 MED ORDER — ALPRAZOLAM 1 MG PO TABS: 1.0000 mg | ORAL_TABLET | Freq: Every evening | ORAL | Status: AC | PRN

## 2012-02-08 NOTE — Progress Notes (Signed)
  Subjective:    Patient ID: Real Cons, female    DOB: Dec 26, 1951, 61 y.o.   MRN: [id number]  HPI  Depression - Still feel sad after losing her job.  Has a lot of baggage from her divorce.  Motivation is down.  Doing well on her medication. She takes care of her 62 year old parents.  She is not interested in cousneling right now.    Review of Systems     Objective:   Physical Exam  Constitutional: She is oriented to person, place, and time. She appears well-developed and well-nourished.  HENT:  Head: Normocephalic and atraumatic.  Cardiovascular: Normal rate, regular rhythm and normal heart sounds.        No carotid bruits.   Pulmonary/Chest: Effort normal and breath sounds normal.  Musculoskeletal: She exhibits no edema.  Neurological: She is alert and oriented to person, place, and time.  Skin: Skin is warm and dry.  Psychiatric: She has a normal mood and affect. Her behavior is normal.          Assessment & Plan:  Depression - PHQ-9 score of 6 today.  Doing well. F/U in 6 months. RF sent. Declined counseling since no insurance righ tnow.   HTn - Much better.  Has stopped eating fast food.  Plans on starting a walking plan. Well controlled today. REfills sent. F/U in 6 mo.

## 2012-02-08 NOTE — Progress Notes (Signed)
Addended by: Nani Gasser D on: 02/08/2012 03:23 PM   Modules accepted: Orders

## 2012-02-11 ENCOUNTER — Other Ambulatory Visit: Payer: Self-pay | Admitting: Family Medicine

## 2012-02-15 ENCOUNTER — Other Ambulatory Visit: Payer: Self-pay | Admitting: Family Medicine

## 2012-03-28 ENCOUNTER — Other Ambulatory Visit: Payer: Self-pay | Admitting: *Deleted

## 2012-03-28 MED ORDER — MONTELUKAST SODIUM 10 MG PO TABS: 10.0000 mg | ORAL_TABLET | Freq: Every day | ORAL | Status: DC

## 2012-03-28 NOTE — Telephone Encounter (Signed)
Pt request an rx fro generic Singulair. Please advise.

## 2012-03-28 NOTE — Telephone Encounter (Signed)
Pt informed

## 2012-03-28 NOTE — Telephone Encounter (Signed)
I don't know that it is generic yet. Maybe? I can send a rx and she can find out.

## 2012-05-12 ENCOUNTER — Other Ambulatory Visit: Payer: Self-pay | Admitting: Family Medicine

## 2012-05-15 ENCOUNTER — Other Ambulatory Visit: Payer: Self-pay | Admitting: Family Medicine

## 2012-05-29 ENCOUNTER — Other Ambulatory Visit: Payer: Self-pay | Admitting: Family Medicine

## 2012-07-19 ENCOUNTER — Other Ambulatory Visit: Payer: Self-pay | Admitting: Family Medicine

## 2012-08-18 ENCOUNTER — Other Ambulatory Visit: Payer: Self-pay | Admitting: Family Medicine

## 2013-01-22 IMAGING — CR DG KNEE COMPLETE 4+V*L*
4 series · 4 of 4 positions shown · non-contrast
Comparison: None.

CLINICAL DATA: Left knee pain and swelling, has had ACL repair by
history

LEFT KNEE - COMPLETE 4+ VIEW

[t knee ap left]
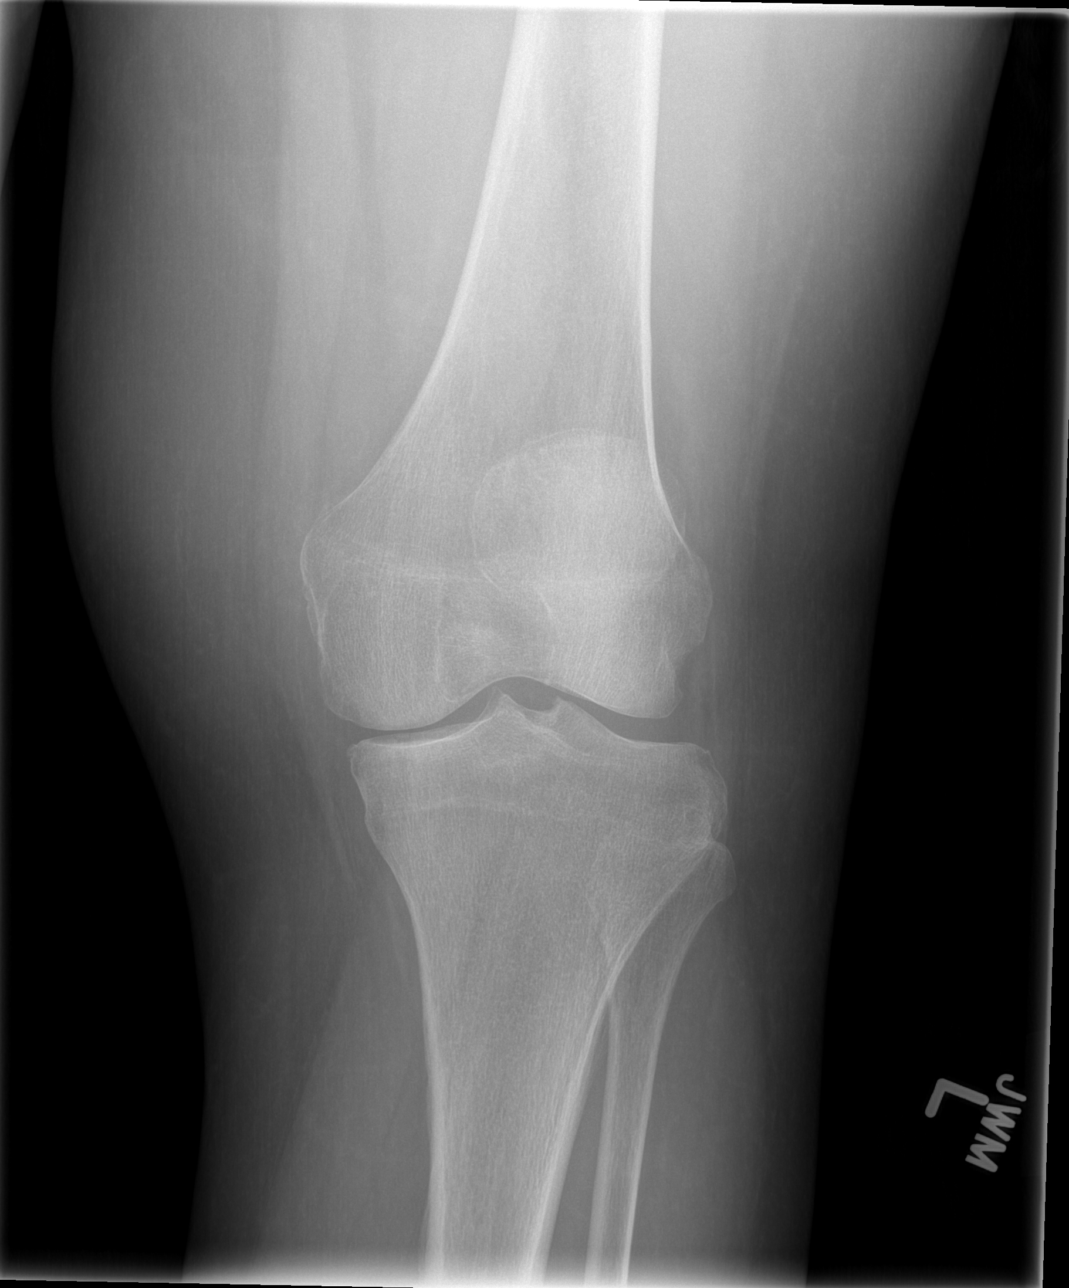

[t knee oblique left (1 of 2)]
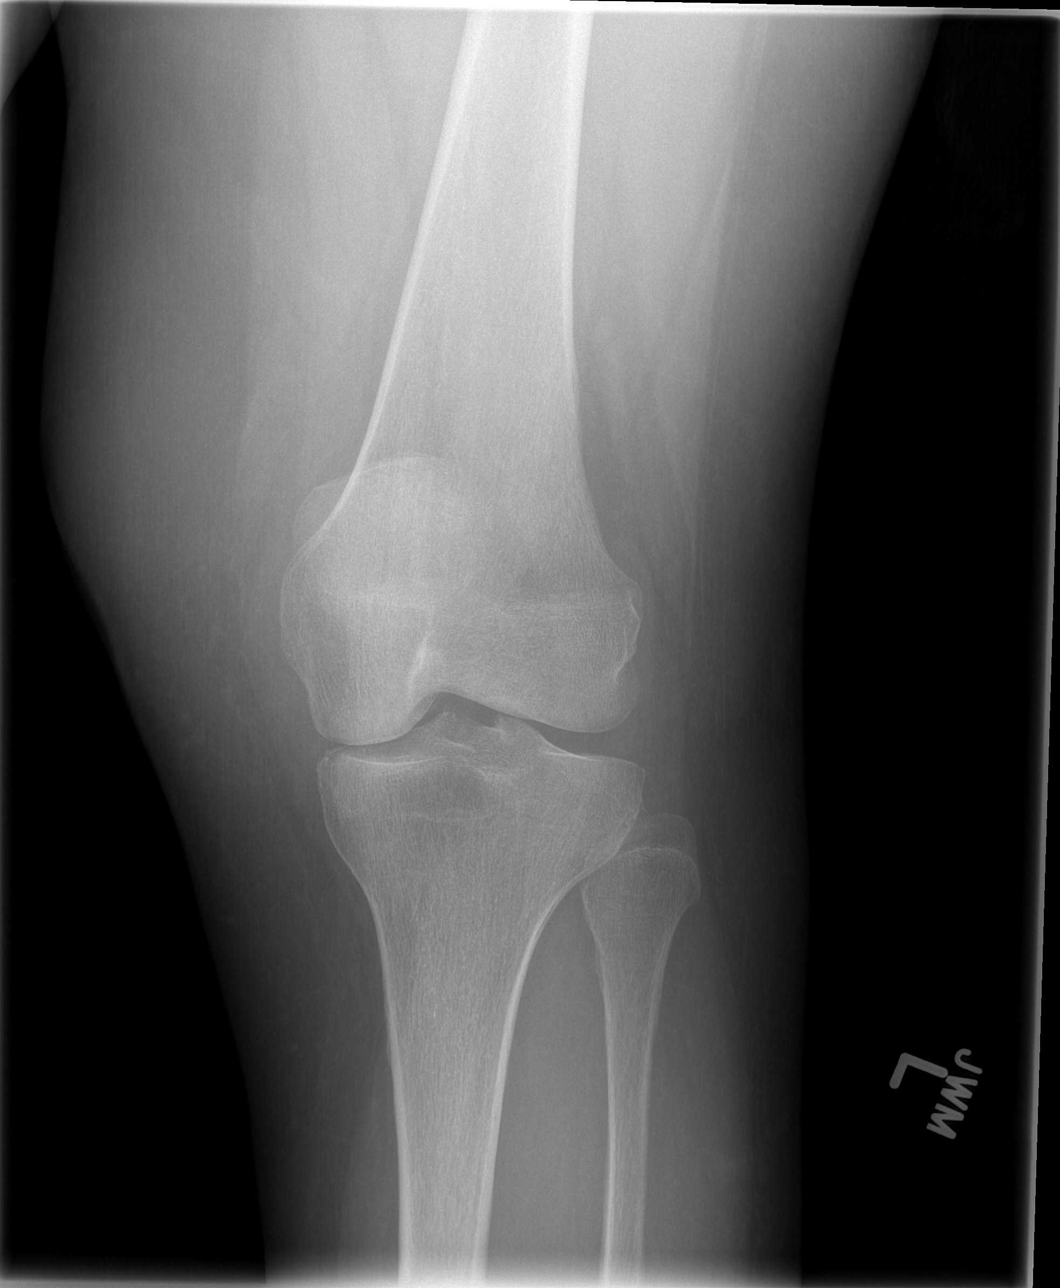

[t knee oblique left (2 of 2)]
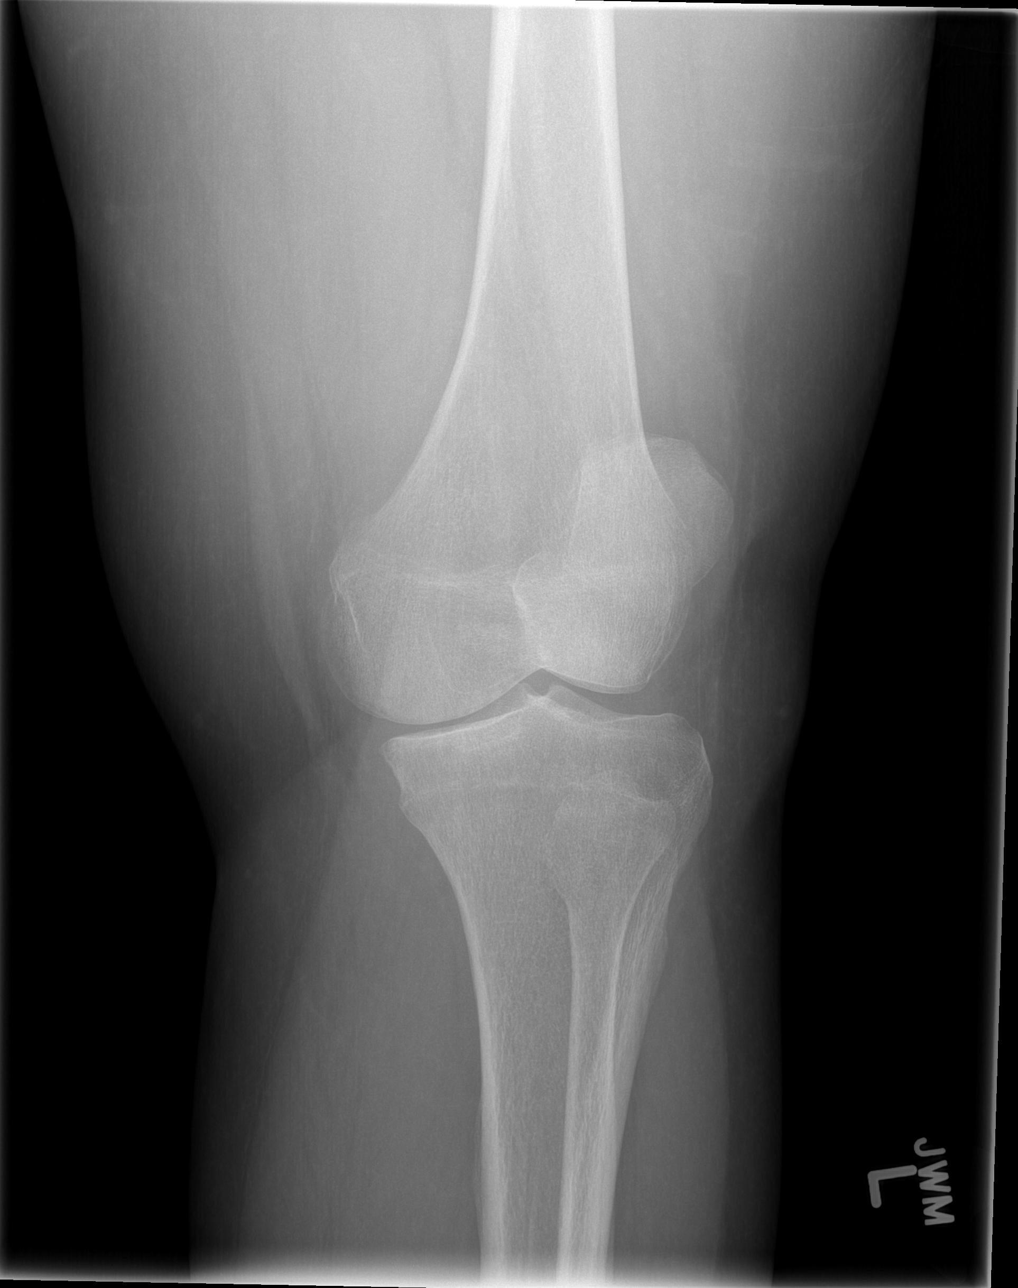

[t knee lat left]
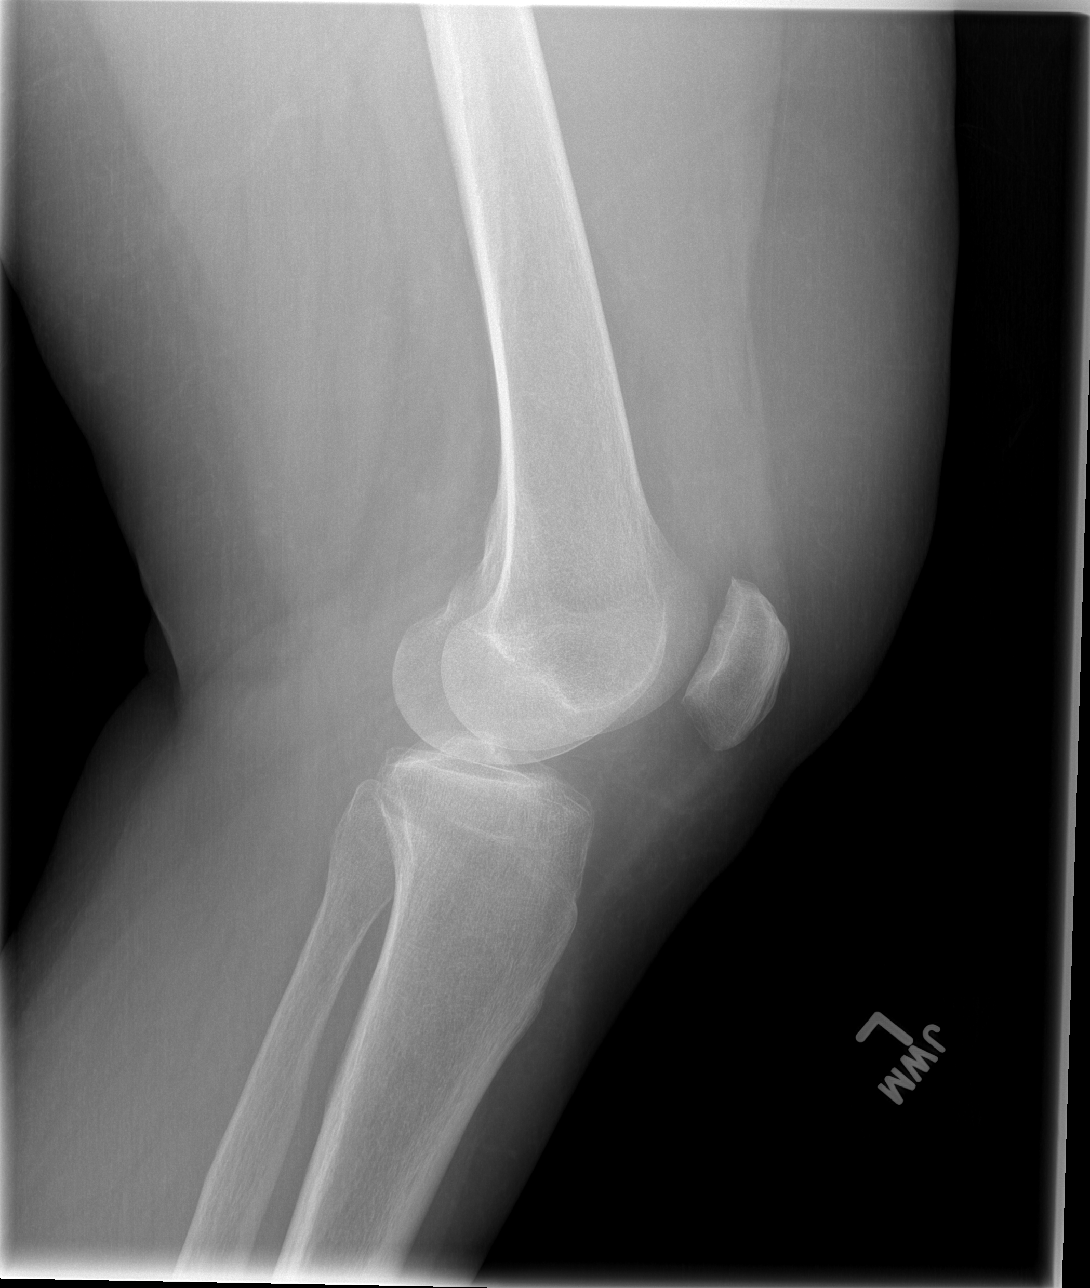

[4 of 4 positions shown; findings below may reference images not displayed]

FINDINGS: There is some loss of medial joint space with sclerosis.
No acute fracture is seen.  There does appear to be a small left
knee joint effusion present.  There is no radiographic evidence of
ACL repair.
IMPRESSION: Small left knee joint effusion.  Mild degenerative joint disease
medially.

## 2014-11-11 ENCOUNTER — Emergency Department
Admission: EM | Admit: 2014-11-11 | Discharge: 2014-11-11 | Disposition: A | Discharge status: Home / Self Care | Payer: BC Managed Care – PPO | Source: Home / Self Care | Attending: Family Medicine | Admitting: Family Medicine

## 2014-11-11 ENCOUNTER — Encounter: Payer: Self-pay | Admitting: Emergency Medicine

## 2014-11-11 DIAGNOSIS — J0100 Acute maxillary sinusitis, unspecified: Secondary | ICD-10-CM

## 2014-11-11 MED ORDER — PREDNISONE 10 MG PO TABS: 30.0000 mg | ORAL_TABLET | Freq: Every day | ORAL | Status: AC

## 2014-11-11 MED ORDER — AZITHROMYCIN 250 MG PO TABS: 250.0000 mg | ORAL_TABLET | Freq: Every day | ORAL | Status: AC

## 2014-11-11 MED ORDER — IPRATROPIUM BROMIDE 0.06 % NA SOLN: 2.0000 | Freq: Four times a day (QID) | NASAL | Status: AC

## 2014-11-11 NOTE — ED Provider Notes (Signed)
Real ConsRuth A Macmurray is a 63 y.o. female who presents to Urgent Care today for Fevers chills body aches.patient notes maxillary sinus pain and pressure and headache. Right worse than left. Symptoms are consistent with sinus infection.patient has tried over-the-counter medications which have not helped much. Typically azithromycin works well for the symptoms.   Past Medical History  Diagnosis Date  . Palpitations   . Arthralgia   . Fatigue   . Bipolar affective disorder   . Hyperlipidemia   . Hypertension    Past Surgical History  Procedure Laterality Date  . Tubal ligation  1980  . Partial hysterectomy  1981  . Cholecystectomy  1995  . Left knee arthroscopic surgery  2011   History  Substance Use Topics  . Smoking status: Never Smoker   . Smokeless tobacco: Not on file  . Alcohol Use: No   ROS as above Medications: No current facility-administered medications for this encounter.   Current Outpatient Prescriptions  Medication Sig Dispense Refill  . escitalopram (LEXAPRO) 10 MG tablet Take 10 mg by mouth daily.    Marland Kitchen. ALPRAZolam (XANAX) 1 MG tablet Take 1 tablet (1 mg total) by mouth at bedtime as needed. 30 tablet 3  . azithromycin (ZITHROMAX) 250 MG tablet Take 1 tablet (250 mg total) by mouth daily. Take first 2 tablets together, then 1 every day until finished. 6 tablet 0  . buPROPion (WELLBUTRIN SR) 100 MG 12 hr tablet Take 1 tablet (100 mg total) by mouth 2 (two) times daily. Generic please. (Patient taking differently: Take 300 mg by mouth daily. Generic please.) 60 tablet 6  . hydrochlorothiazide (MICROZIDE) 12.5 MG capsule Take 1 capsule (12.5 mg total) by mouth every morning. 30 capsule 6  . hydrochlorothiazide (MICROZIDE) 12.5 MG capsule TAKE ONE CAPSULE BY MOUTH ONE TIME DAILY 30 capsule 2  . ipratropium (ATROVENT) 0.06 % nasal spray Place 2 sprays into both nostrils 4 (four) times daily. 15 mL 1  . predniSONE (DELTASONE) 10 MG tablet Take 3 tablets (30 mg total) by mouth  daily. 15 tablet 0  . simvastatin (ZOCOR) 20 MG tablet TAKE ONE TABLET BY MOUTH NIGHTLY AT BEDTIME 90 tablet 2  . [DISCONTINUED] Calcium Carbonate-Vitamin D (CALCIUM-VITAMIN D) 500-200 MG-UNIT per tablet Take 1 tablet by mouth 2 (two) times daily with a meal.      . [DISCONTINUED] montelukast (SINGULAIR) 10 MG tablet Take 1 tablet (10 mg total) by mouth at bedtime. 30 tablet 3  . [DISCONTINUED] sertraline (ZOLOFT) 100 MG tablet Take 1 tablet (100 mg total) by mouth daily. 30 tablet 6   Allergies  Allergen Reactions  . Aspirin     REACTION: THROAT SWELLING  . Codeine     REACTION: HALLUCINATIONS  . Lisinopril Other (See Comments)    Fatigue, sweats.   . Amoxicillin Rash     Exam:  BP 133/80 mmHg  Pulse 78  Temp(Src) 98.2 F (36.8 C) (Oral)  Resp 16  Ht 5\' 3"  (1.6 m)  Wt 212 lb (96.163 kg)  BMI 37.56 kg/m2  SpO2 98% Gen: Well NAD HEENT: EOMI,  MMM tender palpation right maxillary sinus. Normal posterior pharynx and tympanic membranes. Lungs: Normal work of breathing. CTABL Heart: RRR no MRG Abd: NABS, Soft. Nondistended, Nontender Exts: Brisk capillary refill, warm and well perfused.   No results found for this or any previous visit (from the past 24 hour(s)). No results found.  Assessment and Plan: 63 y.o. female with sinusitis. Treatment with azithromycin and prednisone and Atrovent nasal  spray.  Discussed warning signs or symptoms. Please see discharge instructions. Patient expresses understanding.     Rodolph BongEvan S Sabella Traore, MD 11/11/14 [phone]

## 2014-11-11 NOTE — ED Notes (Signed)
Sinus congestion and pain x 3 days.

## 2014-11-11 NOTE — Discharge Instructions (Signed)
Thank you for coming in today. Take the prednisone if not getting better.  Call or go to the emergency room if you get worse, have trouble breathing, have chest pains, or palpitations.   Sinusitis Sinusitis is redness, soreness, and inflammation of the paranasal sinuses. Paranasal sinuses are air pockets within the bones of your face (beneath the eyes, the middle of the forehead, or above the eyes). In healthy paranasal sinuses, mucus is able to drain out, and air is able to circulate through them by way of your nose. However, when your paranasal sinuses are inflamed, mucus and air can become trapped. This can allow bacteria and other germs to grow and cause infection. Sinusitis can develop quickly and last only a short time (acute) or continue over a long period (chronic). Sinusitis that lasts for more than 12 weeks is considered chronic.  CAUSES  Causes of sinusitis include:  Allergies.  Structural abnormalities, such as displacement of the cartilage that separates your nostrils (deviated septum), which can decrease the air flow through your nose and sinuses and affect sinus drainage.  Functional abnormalities, such as when the small hairs (cilia) that line your sinuses and help remove mucus do not work properly or are not present. SIGNS AND SYMPTOMS  Symptoms of acute and chronic sinusitis are the same. The primary symptoms are pain and pressure around the affected sinuses. Other symptoms include:  Upper toothache.  Earache.  Headache.  Bad breath.  Decreased sense of smell and taste.  A cough, which worsens when you are lying flat.  Fatigue.  Fever.  Thick drainage from your nose, which often is green and may contain pus (purulent).  Swelling and warmth over the affected sinuses. DIAGNOSIS  Your health care provider will perform a physical exam. During the exam, your health care provider may:  Look in your nose for signs of abnormal growths in your nostrils (nasal  polyps).  Tap over the affected sinus to check for signs of infection.  View the inside of your sinuses (endoscopy) using an imaging device that has a light attached (endoscope). If your health care provider suspects that you have chronic sinusitis, one or more of the following tests may be recommended:  Allergy tests.  Nasal culture. A sample of mucus is taken from your nose, sent to a lab, and screened for bacteria.  Nasal cytology. A sample of mucus is taken from your nose and examined by your health care provider to determine if your sinusitis is related to an allergy. TREATMENT  Most cases of acute sinusitis are related to a viral infection and will resolve on their own within 10 days. Sometimes medicines are prescribed to help relieve symptoms (pain medicine, decongestants, nasal steroid sprays, or saline sprays).  However, for sinusitis related to a bacterial infection, your health care provider will prescribe antibiotic medicines. These are medicines that will help kill the bacteria causing the infection.  Rarely, sinusitis is caused by a fungal infection. In theses cases, your health care provider will prescribe antifungal medicine. For some cases of chronic sinusitis, surgery is needed. Generally, these are cases in which sinusitis recurs more than 3 times per year, despite other treatments. HOME CARE INSTRUCTIONS   Drink plenty of water. Water helps thin the mucus so your sinuses can drain more easily.  Use a humidifier.  Inhale steam 3 to 4 times a day (for example, sit in the bathroom with the shower running).  Apply a warm, moist washcloth to your face 3 to 4  times a day, or as directed by your health care provider.  Use saline nasal sprays to help moisten and clean your sinuses.  Take medicines only as directed by your health care provider.  If you were prescribed either an antibiotic or antifungal medicine, finish it all even if you start to feel better. SEEK IMMEDIATE  MEDICAL CARE IF:  You have increasing pain or severe headaches.  You have nausea, vomiting, or drowsiness.  You have swelling around your face.  You have vision problems.  You have a stiff neck.  You have difficulty breathing. MAKE SURE YOU:   Understand these instructions.  Will watch your condition.  Will get help right away if you are not doing well or get worse. Document Released: 12/14/2005 Document Revised: 04/30/2014 Document Reviewed: 12/29/2011 [facility] Patient Information 2015 [place], [place]. This information is not intended to replace advice given to you by your health care provider. Make sure you discuss any questions you have with your health care provider.

## 2016-05-10 ENCOUNTER — Encounter: Payer: Self-pay | Admitting: Emergency Medicine

## 2016-05-10 ENCOUNTER — Emergency Department (INDEPENDENT_AMBULATORY_CARE_PROVIDER_SITE_OTHER)
Admission: EM | Admit: 2016-05-10 | Discharge: 2016-05-10 | Disposition: A | Discharge status: Home / Self Care | Payer: Medicare Other | Source: Home / Self Care | Attending: Family Medicine | Admitting: Family Medicine

## 2016-05-10 DIAGNOSIS — J302 Other seasonal allergic rhinitis: Secondary | ICD-10-CM | POA: Diagnosis not present

## 2016-05-10 MED ORDER — TRIAMCINOLONE ACETONIDE 40 MG/ML IJ SUSP
40.0000 mg | Freq: Once | INTRAMUSCULAR | Status: AC
  Administered 2016-05-10: 40 mg via INTRAMUSCULAR

## 2016-05-10 NOTE — ED Notes (Signed)
Patient here with exacerbation of seasonal/environmental allergies. Desires kenolog injection that historically works well for her.

## 2016-05-10 NOTE — ED Provider Notes (Signed)
CSN: [id number]     Arrival date & time 05/10/16  1311 History   First MD Initiated Contact with Patient 05/10/16 1359     Chief Complaint  Patient presents with  . Allergies      HPI Comments: Patient has a long history of seasonal rhinitis, worse in the spring especially when she cuts grass.  For the past two weeks she has been cutting grass frequently, and has had persistent sinus congestion, occasional coughing/wheezing.  No sore throat.  No fevers, chills, and sweats.  She feels well otherwise.  She uses Flonase prior to cutting grass which helps some with her symptoms.  She states that the most effective treatment is an injection of Kenalog.  The history is provided by the patient.    Past Medical History  Diagnosis Date  . Palpitations   . Arthralgia   . Fatigue   . Bipolar affective disorder (HCC)   . Hyperlipidemia   . Hypertension    Past Surgical History  Procedure Laterality Date  . Tubal ligation  1980  . Partial hysterectomy  1981  . Cholecystectomy  1995  . Left knee arthroscopic surgery  2011   Family History  Problem Relation Age of Onset  . Other Mother     palpitation  . Cancer Other     colon  . Heart attack Other   . Hypertension Father   . Diabetes Neg Hx    Social History  Substance Use Topics  . Smoking status: Never Smoker   . Smokeless tobacco: None  . Alcohol Use: No   OB History    No data available     Review of Systems No sore throat + cough No pleuritic pain + wheezing + nasal congestion + post-nasal drainage No sinus pain/pressure No itchy/red eyes No earache No hemoptysis No SOB No fever/chills No nausea No vomiting No abdominal pain No diarrhea No urinary symptoms No skin rash No fatigue No myalgias No headache Used OTC meds without relief  Allergies  Aspirin; Codeine; Lisinopril; and Amoxicillin  Home Medications   Prior to Admission medications   Medication Sig Start Date End Date Taking? Authorizing  Provider  ALPRAZolam Prudy Feeler(XANAX) 1 MG tablet Take 1 tablet (1 mg total) by mouth at bedtime as needed. 02/08/12   Agapito Gamesatherine D Metheney, MD  azithromycin (ZITHROMAX) 250 MG tablet Take 1 tablet (250 mg total) by mouth daily. Take first 2 tablets together, then 1 every day until finished. 11/11/14   Rodolph BongEvan S Corey, MD  buPROPion (WELLBUTRIN SR) 100 MG 12 hr tablet Take 1 tablet (100 mg total) by mouth 2 (two) times daily. Generic please. Patient taking differently: Take 300 mg by mouth daily. Generic please. 02/08/12 02/07/13  Agapito Gamesatherine D Metheney, MD  escitalopram (LEXAPRO) 10 MG tablet Take 10 mg by mouth daily.    Historical Provider, MD  hydrochlorothiazide (MICROZIDE) 12.5 MG capsule Take 1 capsule (12.5 mg total) by mouth every morning. 02/08/12   Agapito Gamesatherine D Metheney, MD  hydrochlorothiazide (MICROZIDE) 12.5 MG capsule TAKE ONE CAPSULE BY MOUTH ONE TIME DAILY 05/29/12   Agapito Gamesatherine D Metheney, MD  ipratropium (ATROVENT) 0.06 % nasal spray Place 2 sprays into both nostrils 4 (four) times daily. 11/11/14   Rodolph BongEvan S Corey, MD  predniSONE (DELTASONE) 10 MG tablet Take 3 tablets (30 mg total) by mouth daily. 11/11/14   Rodolph BongEvan S Corey, MD  simvastatin (ZOCOR) 20 MG tablet TAKE ONE TABLET BY MOUTH NIGHTLY AT BEDTIME 05/12/12   Agapito Gamesatherine D Metheney,  MD   Meds Ordered and Administered this Visit   Medications  triamcinolone acetonide (KENALOG-40) injection 40 mg (not administered)    BP 145/78 mmHg  Pulse 84  Temp(Src) 98.2 F (36.8 C) (Oral)  Resp 16  Ht 5' 3.5" (1.613 m)  Wt 200 lb (90.719 kg)  BMI 34.87 kg/m2  SpO2 97% No data found.   Physical Exam Nursing notes and Vital Signs reviewed. Appearance:  Patient appears stated age, and in no acute distress Eyes:  Pupils are equal, round, and reactive to light and accomodation.  Extraocular movement is intact.  Conjunctivae are not inflamed  Ears:  Canals normal.  Tympanic membranes normal.  Nose:  Mildly congested turbinates.  No sinus tenderness.     Pharynx:  Normal Neck:  Supple.  No adenopathy Lungs:  Clear to auscultation.  Breath sounds are equal.  Moving air well. Heart:  Regular rate and rhythm without murmurs, rubs, or gallops.  Abdomen:  Nontender without masses or hepatosplenomegaly.  Bowel sounds are present.  No CVA or flank tenderness.  Extremities:  No edema.  Skin:  No rash present.   ED Course  Procedures none  MDM   1. Seasonal allergic rhinitis    Kenalog  IM Continue Flonase as needed. Followup with Family Doctor if not improved in one week.     Lattie Haw, MD 05/10/16 1416

## 2016-05-10 NOTE — Discharge Instructions (Signed)
Continue Flonase as needed.   Allergic Rhinitis Allergic rhinitis is when the mucous membranes in the nose respond to allergens. Allergens are particles in the air that cause your body to have an allergic reaction. This causes you to release allergic antibodies. Through a chain of events, these eventually cause you to release histamine into the blood stream. Although meant to protect the body, it is this release of histamine that causes your discomfort, such as frequent sneezing, congestion, and an itchy, runny nose.  CAUSES Seasonal allergic rhinitis (hay fever) is caused by pollen allergens that may come from grasses, trees, and weeds. Year-round allergic rhinitis (perennial allergic rhinitis) is caused by allergens such as house dust mites, pet dander, and mold spores. SYMPTOMS  Nasal stuffiness (congestion).  Itchy, runny nose with sneezing and tearing of the eyes. DIAGNOSIS Your health care provider can help you determine the allergen or allergens that trigger your symptoms. If you and your health care provider are unable to determine the allergen, skin or blood testing may be used. Your health care provider will diagnose your condition after taking your health history and performing a physical exam. Your health care provider may assess you for other related conditions, such as asthma, pink eye, or an ear infection. TREATMENT Allergic rhinitis does not have a cure, but it can be controlled by:  Medicines that block allergy symptoms. These may include allergy shots, nasal sprays, and oral antihistamines.  Avoiding the allergen. Hay fever may often be treated with antihistamines in pill or nasal spray forms. Antihistamines block the effects of histamine. There are over-the-counter medicines that may help with nasal congestion and swelling around the eyes. Check with your health care provider before taking or giving this medicine. If avoiding the allergen or the medicine prescribed do not  work, there are many new medicines your health care provider can prescribe. Stronger medicine may be used if initial measures are ineffective. Desensitizing injections can be used if medicine and avoidance does not work. Desensitization is when a patient is given ongoing shots until the body becomes less sensitive to the allergen. Make sure you follow up with your health care provider if problems continue. HOME CARE INSTRUCTIONS It is not possible to completely avoid allergens, but you can reduce your symptoms by taking steps to limit your exposure to them. It helps to know exactly what you are allergic to so that you can avoid your specific triggers. SEEK MEDICAL CARE IF:  You have a fever.  You develop a cough that does not stop easily (persistent).  You have shortness of breath.  You start wheezing.  Symptoms interfere with normal daily activities.   This information is not intended to replace advice given to you by your health care provider. Make sure you discuss any questions you have with your health care provider.   Document Released: 09/08/2001 Document Revised: 01/04/2015 Document Reviewed: 08/21/2013 [employer] Interactive Patient Education [employer]2016 [employer]
